# Patient Record
Sex: Male | Born: 1945 | Race: White | Hispanic: No | Marital: Married | Smoking: Former smoker
Health system: Southern US, Community
[De-identification: ages and names within clinical notes are randomized; demographics above are authoritative.]

## PROBLEM LIST (undated history)

## (undated) DIAGNOSIS — E785 Hyperlipidemia, unspecified: Secondary | ICD-10-CM

## (undated) DIAGNOSIS — C4492 Squamous cell carcinoma of skin, unspecified: Secondary | ICD-10-CM

## (undated) DIAGNOSIS — C439 Malignant melanoma of skin, unspecified: Secondary | ICD-10-CM

## (undated) DIAGNOSIS — C4491 Basal cell carcinoma of skin, unspecified: Secondary | ICD-10-CM

## (undated) DIAGNOSIS — H9190 Unspecified hearing loss, unspecified ear: Secondary | ICD-10-CM

## (undated) DIAGNOSIS — D179 Benign lipomatous neoplasm, unspecified: Secondary | ICD-10-CM

## (undated) DIAGNOSIS — R159 Full incontinence of feces: Secondary | ICD-10-CM

## (undated) DIAGNOSIS — I10 Essential (primary) hypertension: Secondary | ICD-10-CM

## (undated) DIAGNOSIS — M6530 Trigger finger, unspecified finger: Secondary | ICD-10-CM

## (undated) HISTORY — DX: Benign lipomatous neoplasm, unspecified: D17.9

## (undated) HISTORY — DX: Full incontinence of feces: R15.9

## (undated) HISTORY — DX: Essential (primary) hypertension: I10

## (undated) HISTORY — PX: TONSILLECTOMY: SUR1361

## (undated) HISTORY — DX: Hyperlipidemia, unspecified: E78.5

## (undated) HISTORY — DX: Unspecified hearing loss, unspecified ear: H91.90

## (undated) HISTORY — DX: Trigger finger, unspecified finger: M65.30

---
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
## 1984-09-08 HISTORY — PX: TOTAL KNEE ARTHROPLASTY: SHX125

## 1999-12-27 ENCOUNTER — Ambulatory Visit (HOSPITAL_BASED_OUTPATIENT_CLINIC_OR_DEPARTMENT_OTHER): Admission: RE | Admit: 1999-12-27 | Discharge: 1999-12-27 | Payer: Self-pay | Admitting: Orthopedic Surgery

## 2002-07-21 ENCOUNTER — Ambulatory Visit (HOSPITAL_COMMUNITY): Admission: RE | Admit: 2002-07-21 | Discharge: 2002-07-21 | Payer: Self-pay | Admitting: Gastroenterology

## 2002-08-01 ENCOUNTER — Ambulatory Visit (HOSPITAL_COMMUNITY): Admission: RE | Admit: 2002-08-01 | Discharge: 2002-08-01 | Payer: Self-pay | Admitting: *Deleted

## 2009-08-01 ENCOUNTER — Encounter: Admission: RE | Admit: 2009-08-01 | Discharge: 2009-08-01 | Payer: Self-pay | Admitting: Family Medicine

## 2011-01-24 NOTE — Op Note (Signed)
[facility]  Patient:    Miguel Kirby, Miguel Kirby                           MRN: [id number] Proc. Date: 12/27/99 Adm. Date:  [id number] Attending:  Marlowe [place] CC:         Artist Pais Weingold, M.D. x 2                           Operative Report  PREOPERATIVE DIAGNOSIS:  Right long finger stenosing tenosynovitis.  POSTOPERATIVE DIAGNOSIS:  Right long finger stenosing tenosynovitis.  PROCEDURE:  Release of right long finger A1 pulley.  SURGEON:  Artist Pais. Mina Marble, M.D.  ANESTHESIA:  Monitored anesthesia care with 2% plain lidocaine field block.  TOURNIQUET TIME:  Ten minutes.  COMPLICATIONS:  None.  DRAINS:  None.  DESCRIPTION OF PROCEDURE:  The patient was taken to the operating room after the induction of adequate IV sedation.  The right upper extremity was prepped and draped in the usual sterile fashion.  Esmarch was used to exsanguinate the limb and the tourniquet was inflated to 250 mmHg.  At this point in time, 1.5 cc of 2% plain lidocaine and 0.25% Marcaine were injected into the palmar aspect of the right and in the area of the A1 pulley of the long finger.  A 1.5 cm incision was made. he incision was taken down through the skin and subcutaneous tissues.  The neurovascular bundles on the both the radial and ulnar side were retracted, thus exposing the underlying A1 pulley.  The A1 pulley was split with a 15 blade, thus exposing the underlying _______ tendons.  All adhesions between the sheaths and  the tendons were lysed.  The wound was then thoroughly irrigated and closed with 4-0 nylon and 4-0 horizontal mattress sutures.  A sterile dressing of Xeroform,  4 x 4s, fluffs, and a compressive dressing was applied.  The patient tolerated the procedure well and went to the recovery room in a stable fashion. DD:  12/27/99 TD:  12/27/99 Job: [id number]

## 2011-01-24 NOTE — Op Note (Signed)
   NAMEIGNACE, MANDIGO                              ACCOUNT NO.:  [id number]   MEDICAL RECORD NO.:  [id number]                   PATIENT TYPE:  AMB   LOCATION:  ENDO                                 FACILITY:  [facility]   PHYSICIAN:  Althea Grimmer. Luther Parody, M.D.            DATE OF BIRTH:  [birth year]   DATE OF PROCEDURE:  DATE OF DISCHARGE:  08/01/2002                                 OPERATIVE REPORT   PROCEDURE:  Anorectal manometry.   The anal sphincter resting tone maximal average pressure was 54.8 mmHg and  occurred 2 cm from the anal verge.  This is within normal limits, and  reflects the pressure of the internal sphincter.  The maximal voluntary  contraction pressure reflective of the external sphincter was 196.7 mmHg and  occurred 3 cm from the anal verge.  This, too, is within normal limits.  The  sphincter length was 4.5 cm which is normal.  The maximal squeeze duration  lasted for 43.3 seconds which is voluntary and also within normal limits.  On Vector grams, there was mild decrease in resting pressure anteriorly.  Squeeze pressure were fairly symmetric.  The rectal anal inhibitory reflex  was present and normal.  The threshold of sensation was 20 cc in the rectal  balloon which is borderline normal in that most people can sense 15 cc.  The  patient had his first urge to defecate at 200 cc, and he tolerated 250 cc in  the rectal balloon.  These values are also normal.  Rectal compliance was  normal until 200 cc was instilled in the rectal balloon at which time he had  some spasm.  Preprocedure of the rectal area and digital rectal exam were  unremarkable.   IMPRESSION:  This is essentially a normal study.  The only potential  abnormalities noted were borderline sensation at 20 cc being instilled in  the rectal balloon before the patient clearly sensed it, and a mild decrease  in pressure Anteriorly.  I do not think either one of these would contribute  to his incontinence, and I  suspect that he is not evacuating completely or  cleaning himself adequately and that there is not a true physiologic problem  here.                                               Althea Grimmer. Luther Parody, M.D.    PJS/MEDQ  D:  08/08/2002  T:  08/09/2002  Job:  [id number]   cc:   Tasia Catchings, M.D.  [address]  [place]  [place] [address]  Fax: [phone]

## 2012-03-26 DIAGNOSIS — I10 Essential (primary) hypertension: Secondary | ICD-10-CM | POA: Diagnosis not present

## 2012-03-26 DIAGNOSIS — E782 Mixed hyperlipidemia: Secondary | ICD-10-CM | POA: Diagnosis not present

## 2012-03-26 DIAGNOSIS — Z125 Encounter for screening for malignant neoplasm of prostate: Secondary | ICD-10-CM | POA: Diagnosis not present

## 2012-03-26 DIAGNOSIS — D179 Benign lipomatous neoplasm, unspecified: Secondary | ICD-10-CM | POA: Diagnosis not present

## 2012-03-26 DIAGNOSIS — R42 Dizziness and giddiness: Secondary | ICD-10-CM | POA: Diagnosis not present

## 2012-03-26 DIAGNOSIS — Z1211 Encounter for screening for malignant neoplasm of colon: Secondary | ICD-10-CM | POA: Diagnosis not present

## 2012-03-26 DIAGNOSIS — Z0000 Encounter for general adult medical examination without abnormal findings: Secondary | ICD-10-CM | POA: Diagnosis not present

## 2012-05-27 DIAGNOSIS — L570 Actinic keratosis: Secondary | ICD-10-CM | POA: Diagnosis not present

## 2012-05-27 DIAGNOSIS — C44319 Basal cell carcinoma of skin of other parts of face: Secondary | ICD-10-CM | POA: Diagnosis not present

## 2012-05-27 DIAGNOSIS — L821 Other seborrheic keratosis: Secondary | ICD-10-CM | POA: Diagnosis not present

## 2012-06-23 DIAGNOSIS — C44319 Basal cell carcinoma of skin of other parts of face: Secondary | ICD-10-CM | POA: Diagnosis not present

## 2012-07-06 DIAGNOSIS — Z23 Encounter for immunization: Secondary | ICD-10-CM | POA: Diagnosis not present

## 2012-08-02 DIAGNOSIS — K5730 Diverticulosis of large intestine without perforation or abscess without bleeding: Secondary | ICD-10-CM | POA: Diagnosis not present

## 2012-08-02 DIAGNOSIS — Z1211 Encounter for screening for malignant neoplasm of colon: Secondary | ICD-10-CM | POA: Diagnosis not present

## 2012-09-23 DIAGNOSIS — R05 Cough: Secondary | ICD-10-CM | POA: Diagnosis not present

## 2012-09-23 DIAGNOSIS — J40 Bronchitis, not specified as acute or chronic: Secondary | ICD-10-CM | POA: Diagnosis not present

## 2012-09-23 DIAGNOSIS — J329 Chronic sinusitis, unspecified: Secondary | ICD-10-CM | POA: Diagnosis not present

## 2012-09-27 DIAGNOSIS — E782 Mixed hyperlipidemia: Secondary | ICD-10-CM | POA: Diagnosis not present

## 2012-09-27 DIAGNOSIS — R509 Fever, unspecified: Secondary | ICD-10-CM | POA: Diagnosis not present

## 2013-02-04 DIAGNOSIS — D489 Neoplasm of uncertain behavior, unspecified: Secondary | ICD-10-CM | POA: Diagnosis not present

## 2013-02-04 DIAGNOSIS — L259 Unspecified contact dermatitis, unspecified cause: Secondary | ICD-10-CM | POA: Diagnosis not present

## 2013-02-04 DIAGNOSIS — L820 Inflamed seborrheic keratosis: Secondary | ICD-10-CM | POA: Diagnosis not present

## 2013-02-04 DIAGNOSIS — M795 Residual foreign body in soft tissue: Secondary | ICD-10-CM | POA: Diagnosis not present

## 2013-04-07 DIAGNOSIS — D179 Benign lipomatous neoplasm, unspecified: Secondary | ICD-10-CM | POA: Diagnosis not present

## 2013-04-07 DIAGNOSIS — R5383 Other fatigue: Secondary | ICD-10-CM | POA: Diagnosis not present

## 2013-04-07 DIAGNOSIS — E782 Mixed hyperlipidemia: Secondary | ICD-10-CM | POA: Diagnosis not present

## 2013-04-07 DIAGNOSIS — I10 Essential (primary) hypertension: Secondary | ICD-10-CM | POA: Diagnosis not present

## 2013-04-07 DIAGNOSIS — Z1331 Encounter for screening for depression: Secondary | ICD-10-CM | POA: Diagnosis not present

## 2013-04-07 DIAGNOSIS — Z125 Encounter for screening for malignant neoplasm of prostate: Secondary | ICD-10-CM | POA: Diagnosis not present

## 2013-04-07 DIAGNOSIS — R5381 Other malaise: Secondary | ICD-10-CM | POA: Diagnosis not present

## 2013-04-07 DIAGNOSIS — Z0000 Encounter for general adult medical examination without abnormal findings: Secondary | ICD-10-CM | POA: Diagnosis not present

## 2013-04-18 DIAGNOSIS — H2510 Age-related nuclear cataract, unspecified eye: Secondary | ICD-10-CM | POA: Diagnosis not present

## 2013-06-15 DIAGNOSIS — H903 Sensorineural hearing loss, bilateral: Secondary | ICD-10-CM | POA: Diagnosis not present

## 2013-07-04 DIAGNOSIS — Z23 Encounter for immunization: Secondary | ICD-10-CM | POA: Diagnosis not present

## 2013-07-21 DIAGNOSIS — Z85828 Personal history of other malignant neoplasm of skin: Secondary | ICD-10-CM | POA: Diagnosis not present

## 2013-07-21 DIAGNOSIS — L570 Actinic keratosis: Secondary | ICD-10-CM | POA: Diagnosis not present

## 2013-08-08 DIAGNOSIS — L570 Actinic keratosis: Secondary | ICD-10-CM | POA: Diagnosis not present

## 2013-08-08 DIAGNOSIS — Z85828 Personal history of other malignant neoplasm of skin: Secondary | ICD-10-CM | POA: Diagnosis not present

## 2013-08-08 DIAGNOSIS — L253 Unspecified contact dermatitis due to other chemical products: Secondary | ICD-10-CM | POA: Diagnosis not present

## 2014-01-04 DIAGNOSIS — L219 Seborrheic dermatitis, unspecified: Secondary | ICD-10-CM | POA: Diagnosis not present

## 2014-01-04 DIAGNOSIS — L570 Actinic keratosis: Secondary | ICD-10-CM | POA: Diagnosis not present

## 2014-01-04 DIAGNOSIS — D235 Other benign neoplasm of skin of trunk: Secondary | ICD-10-CM | POA: Diagnosis not present

## 2014-02-20 DIAGNOSIS — H1044 Vernal conjunctivitis: Secondary | ICD-10-CM | POA: Diagnosis not present

## 2014-05-05 DIAGNOSIS — Z23 Encounter for immunization: Secondary | ICD-10-CM | POA: Diagnosis not present

## 2014-05-05 DIAGNOSIS — E782 Mixed hyperlipidemia: Secondary | ICD-10-CM | POA: Diagnosis not present

## 2014-05-05 DIAGNOSIS — I10 Essential (primary) hypertension: Secondary | ICD-10-CM | POA: Diagnosis not present

## 2014-05-05 DIAGNOSIS — Z0000 Encounter for general adult medical examination without abnormal findings: Secondary | ICD-10-CM | POA: Diagnosis not present

## 2014-05-05 DIAGNOSIS — Z125 Encounter for screening for malignant neoplasm of prostate: Secondary | ICD-10-CM | POA: Diagnosis not present

## 2014-06-21 DIAGNOSIS — L219 Seborrheic dermatitis, unspecified: Secondary | ICD-10-CM | POA: Diagnosis not present

## 2014-06-21 DIAGNOSIS — L570 Actinic keratosis: Secondary | ICD-10-CM | POA: Diagnosis not present

## 2014-06-21 DIAGNOSIS — X32XXXD Exposure to sunlight, subsequent encounter: Secondary | ICD-10-CM | POA: Diagnosis not present

## 2014-07-19 DIAGNOSIS — L219 Seborrheic dermatitis, unspecified: Secondary | ICD-10-CM | POA: Diagnosis not present

## 2014-10-11 DIAGNOSIS — H10413 Chronic giant papillary conjunctivitis, bilateral: Secondary | ICD-10-CM | POA: Diagnosis not present

## 2014-10-11 DIAGNOSIS — H2513 Age-related nuclear cataract, bilateral: Secondary | ICD-10-CM | POA: Diagnosis not present

## 2014-11-06 ENCOUNTER — Encounter: Payer: Self-pay | Admitting: *Deleted

## 2014-11-14 DIAGNOSIS — H1033 Unspecified acute conjunctivitis, bilateral: Secondary | ICD-10-CM | POA: Diagnosis not present

## 2014-12-19 DIAGNOSIS — H00012 Hordeolum externum right lower eyelid: Secondary | ICD-10-CM | POA: Diagnosis not present

## 2015-01-31 DIAGNOSIS — L570 Actinic keratosis: Secondary | ICD-10-CM | POA: Diagnosis not present

## 2015-01-31 DIAGNOSIS — Z1283 Encounter for screening for malignant neoplasm of skin: Secondary | ICD-10-CM | POA: Diagnosis not present

## 2015-01-31 DIAGNOSIS — X32XXXD Exposure to sunlight, subsequent encounter: Secondary | ICD-10-CM | POA: Diagnosis not present

## 2015-02-13 DIAGNOSIS — H02104 Unspecified ectropion of left upper eyelid: Secondary | ICD-10-CM | POA: Diagnosis not present

## 2015-02-13 DIAGNOSIS — H10413 Chronic giant papillary conjunctivitis, bilateral: Secondary | ICD-10-CM | POA: Diagnosis not present

## 2015-02-13 DIAGNOSIS — H02101 Unspecified ectropion of right upper eyelid: Secondary | ICD-10-CM | POA: Diagnosis not present

## 2015-05-09 DIAGNOSIS — C44321 Squamous cell carcinoma of skin of nose: Secondary | ICD-10-CM | POA: Diagnosis not present

## 2015-05-09 DIAGNOSIS — L570 Actinic keratosis: Secondary | ICD-10-CM | POA: Diagnosis not present

## 2015-05-09 DIAGNOSIS — X32XXXD Exposure to sunlight, subsequent encounter: Secondary | ICD-10-CM | POA: Diagnosis not present

## 2015-05-23 DIAGNOSIS — Z85828 Personal history of other malignant neoplasm of skin: Secondary | ICD-10-CM | POA: Diagnosis not present

## 2015-05-23 DIAGNOSIS — Z08 Encounter for follow-up examination after completed treatment for malignant neoplasm: Secondary | ICD-10-CM | POA: Diagnosis not present

## 2015-07-05 ENCOUNTER — Ambulatory Visit
Admission: RE | Admit: 2015-07-05 | Discharge: 2015-07-05 | Disposition: A | Discharge status: Home / Self Care | Payer: Medicare Other | Source: Ambulatory Visit | Attending: Family Medicine | Admitting: Family Medicine

## 2015-07-05 ENCOUNTER — Other Ambulatory Visit: Payer: Self-pay | Admitting: Family Medicine

## 2015-07-05 DIAGNOSIS — Z23 Encounter for immunization: Secondary | ICD-10-CM | POA: Diagnosis not present

## 2015-07-05 DIAGNOSIS — R0600 Dyspnea, unspecified: Secondary | ICD-10-CM

## 2015-07-05 DIAGNOSIS — B356 Tinea cruris: Secondary | ICD-10-CM | POA: Diagnosis not present

## 2015-07-05 DIAGNOSIS — I10 Essential (primary) hypertension: Secondary | ICD-10-CM | POA: Diagnosis not present

## 2015-07-05 DIAGNOSIS — Z1211 Encounter for screening for malignant neoplasm of colon: Secondary | ICD-10-CM | POA: Diagnosis not present

## 2015-07-05 DIAGNOSIS — R0602 Shortness of breath: Secondary | ICD-10-CM | POA: Diagnosis not present

## 2015-07-05 DIAGNOSIS — Z0000 Encounter for general adult medical examination without abnormal findings: Secondary | ICD-10-CM | POA: Diagnosis not present

## 2015-07-05 DIAGNOSIS — E782 Mixed hyperlipidemia: Secondary | ICD-10-CM | POA: Diagnosis not present

## 2015-07-05 DIAGNOSIS — Z125 Encounter for screening for malignant neoplasm of prostate: Secondary | ICD-10-CM | POA: Diagnosis not present

## 2015-07-19 DIAGNOSIS — I10 Essential (primary) hypertension: Secondary | ICD-10-CM | POA: Insufficient documentation

## 2015-07-19 DIAGNOSIS — R0602 Shortness of breath: Secondary | ICD-10-CM | POA: Insufficient documentation

## 2015-07-19 DIAGNOSIS — E782 Mixed hyperlipidemia: Secondary | ICD-10-CM | POA: Insufficient documentation

## 2015-07-23 ENCOUNTER — Encounter: Payer: Self-pay | Admitting: Internal Medicine

## 2015-07-23 ENCOUNTER — Ambulatory Visit (INDEPENDENT_AMBULATORY_CARE_PROVIDER_SITE_OTHER): Payer: Medicare Other | Admitting: Internal Medicine

## 2015-07-23 VITALS — BP 124/66 | HR 61 | Ht 69.0 in | Wt 173.1 lb

## 2015-07-23 DIAGNOSIS — R0602 Shortness of breath: Secondary | ICD-10-CM | POA: Diagnosis not present

## 2015-07-23 DIAGNOSIS — I10 Essential (primary) hypertension: Secondary | ICD-10-CM | POA: Diagnosis not present

## 2015-07-23 NOTE — Patient Instructions (Signed)

## 2015-07-23 NOTE — Progress Notes (Signed)
Cardiology Office Note   Date:  07/23/2015   ID:  Miguel Kirby, DOB [birth year], MRN [id number]  PCP:  Miguel Neer, MD  Cardiologist:   Miguel Carnes, MD   No chief complaint on file.  Referred for SOB    History of Present Illness: Miguel Kirby is a 69 y.o. male with a history of HTN, HL and depression   slo a history of dyspnea    Pt says that he has no CP   Has noticed past6 months  SOB  Has to stop doing things  Catch  Breath Lightheaded occasionally  No presynope or syncpe   No PND  No edema   Pt says he can walk behind self propelled mower  Does OK  If has to push however has to stop  Did not have to do this 6 months ago       Current Outpatient Prescriptions  Medication Sig Dispense Refill  . amLODipine (NORVASC) 5 MG tablet Take 1 tablet by mouth daily.    . cetirizine (ZYRTEC) 10 MG tablet Take 10 mg by mouth daily.    Marland Kitchen losartan-hydrochlorothiazide (HYZAAR) 100-25 MG tablet Take 1 tablet by mouth daily.    . simvastatin (ZOCOR) 10 MG tablet Take 1 tablet by mouth daily.     No current facility-administered medications for this visit.    Allergies:   Review of patient's allergies indicates no known allergies.   Past Medical History  Diagnosis Date  . HLD (hyperlipidemia)   . Fecal incontinence   . HL (hearing loss)   . HBP (high blood pressure)   . Trigger finger     multiple  . Lipoma     Past Surgical History  Procedure Laterality Date  . Tonsillectomy    . Total knee arthroplasty Right 1986     Social History:  The patient  reports that he has quit smoking. He does not have any smokeless tobacco history on file. He reports that he drinks about 6.0 oz of alcohol per week.   Family History:  The patient's family history includes Congestive Heart Failure in his father; Coronary artery disease in his mother; Hypertension in his father. There is no history of Other.    ROS:  Please see the history of present illness. All other systems are  reviewed and  Negative to the above problem except as noted.    PHYSICAL EXAM: VS:  BP 124/66 mmHg  Pulse 61  Ht 5\' 9"  (1.753 m)  Wt 78.527 kg (173 lb 1.9 oz)  BMI 25.55 kg/m2  GEN: Well nourished, well developed, in no acute distress HEENT: normal Neck: no JVD, carotid bruits, or masses Cardiac: RRR; no murmurs, rubs, or gallops,no edema  Respiratory:  clear to auscultation bilaterally, normal work of breathing GI: soft, nontender, nondistended, + BS  No hepatomegaly  MS: no deformity Moving all extremities   Skin: warm and dry, no rash Neuro:  Strength and sensation are intact Psych: euthymic mood, full affect   EKG:  EKG is ordered today.  SR 60  Occasional PAC   Lipid Panel No results found for: CHOL, TRIG, HDL, CHOLHDL, VLDL, LDLCALC, LDLDIRECT    Wt Readings from Last 3 Encounters:  07/23/15 78.527 kg (173 lb 1.9 oz)      ASSESSMENT AND PLAN:  1  SOB  Exam normal  CXR and and labs OK  EKG OK   Would set up for echo to evaluate systolic and diastolic function, pulm pressuress If normal recomm  stress myoview  If LVEF down then cath Contnue activies as tolerated  2.  HTN  Good control  3.  HL  Good control  On review of outside labs    F/U based on test results       Signed, Miguel Carnes, MD  07/23/2015 11:56 AM    [name], [name], [place]  [address] Phone: [phone]; Fax: [phone]

## 2015-07-27 ENCOUNTER — Ambulatory Visit (HOSPITAL_COMMUNITY): Payer: Medicare Other | Attending: Cardiology

## 2015-07-27 ENCOUNTER — Other Ambulatory Visit: Payer: Self-pay

## 2015-07-27 DIAGNOSIS — R0602 Shortness of breath: Secondary | ICD-10-CM | POA: Diagnosis present

## 2015-07-27 DIAGNOSIS — I071 Rheumatic tricuspid insufficiency: Secondary | ICD-10-CM | POA: Diagnosis not present

## 2015-07-27 DIAGNOSIS — I340 Nonrheumatic mitral (valve) insufficiency: Secondary | ICD-10-CM | POA: Diagnosis not present

## 2015-07-27 DIAGNOSIS — Z87891 Personal history of nicotine dependence: Secondary | ICD-10-CM | POA: Insufficient documentation

## 2015-07-27 DIAGNOSIS — I5189 Other ill-defined heart diseases: Secondary | ICD-10-CM | POA: Insufficient documentation

## 2015-07-27 DIAGNOSIS — E785 Hyperlipidemia, unspecified: Secondary | ICD-10-CM | POA: Insufficient documentation

## 2015-07-31 ENCOUNTER — Telehealth: Payer: Self-pay | Admitting: *Deleted

## 2015-07-31 DIAGNOSIS — R0602 Shortness of breath: Secondary | ICD-10-CM

## 2015-07-31 NOTE — Telephone Encounter (Signed)
Informed patient of echo results and that scheduler will be calling to set up myoview.  Pt verbalizes understanding/agreement.

## 2015-07-31 NOTE — Telephone Encounter (Signed)
-----   Message from Dorris Carnes V, MD sent at 07/27/2015  4:22 PM EST ----- Echo shws normal pumping function of the heart No significant valve problems   WOUld recomm stres myoview to r/o ischemia Carlton Adam myoview if he cannot walk though he should be able to)

## 2015-08-16 ENCOUNTER — Telehealth (HOSPITAL_COMMUNITY): Payer: Self-pay | Admitting: *Deleted

## 2015-08-16 NOTE — Telephone Encounter (Signed)
Patient given detailed instructions per Myocardial Perfusion Study Information Sheet for the test on 08/21/15 at 945. Patient notified to arrive 15 minutes early and that it is imperative to arrive on time for appointment to keep from having the test rescheduled.  If you need to cancel or reschedule your appointment, please call the office within 24 hours of your appointment. Failure to do so may result in a cancellation of your appointment, and a $50 no show fee. Patient verbalized understanding.Hubbard Robinson, RN

## 2015-08-21 ENCOUNTER — Ambulatory Visit (HOSPITAL_COMMUNITY): Payer: Medicare Other | Attending: Cardiology

## 2015-08-21 DIAGNOSIS — R0602 Shortness of breath: Secondary | ICD-10-CM

## 2015-08-21 DIAGNOSIS — I10 Essential (primary) hypertension: Secondary | ICD-10-CM | POA: Diagnosis not present

## 2015-08-21 DIAGNOSIS — R9439 Abnormal result of other cardiovascular function study: Secondary | ICD-10-CM | POA: Insufficient documentation

## 2015-08-21 DIAGNOSIS — R0609 Other forms of dyspnea: Secondary | ICD-10-CM | POA: Diagnosis not present

## 2015-08-21 LAB — MYOCARDIAL PERFUSION IMAGING
CHL CUP MPHR: 149 {beats}/min
CHL CUP RESTING HR STRESS: 65 {beats}/min
CSEPEDS: 0 s
CSEPEW: 8.5 METS
Exercise duration (min): 7 min
LVDIAVOL: 69 mL
LVSYSVOL: 17 mL
NUC STRESS TID: 0.8
Peak HR: 130 {beats}/min
Percent HR: 87 %
RATE: 0.32
SDS: 6
SRS: 0
SSS: 6

## 2015-08-21 MED ORDER — REGADENOSON 0.4 MG/5ML IV SOLN
0.4000 mg | Freq: Once | INTRAVENOUS | Status: AC
  Administered 2015-08-21: 0.4 mg via INTRAVENOUS

## 2015-08-21 MED ORDER — TECHNETIUM TC 99M SESTAMIBI GENERIC - CARDIOLITE
31.7000 | Freq: Once | INTRAVENOUS | Status: AC | PRN
  Administered 2015-08-21: 31.7 via INTRAVENOUS

## 2015-08-21 MED ORDER — TECHNETIUM TC 99M SESTAMIBI GENERIC - CARDIOLITE
10.6000 | Freq: Once | INTRAVENOUS | Status: AC | PRN
  Administered 2015-08-21: 11 via INTRAVENOUS

## 2015-09-05 DIAGNOSIS — L570 Actinic keratosis: Secondary | ICD-10-CM | POA: Diagnosis not present

## 2015-09-05 DIAGNOSIS — L309 Dermatitis, unspecified: Secondary | ICD-10-CM | POA: Diagnosis not present

## 2015-09-05 DIAGNOSIS — X32XXXD Exposure to sunlight, subsequent encounter: Secondary | ICD-10-CM | POA: Diagnosis not present

## 2015-10-08 DIAGNOSIS — D239 Other benign neoplasm of skin, unspecified: Secondary | ICD-10-CM | POA: Diagnosis not present

## 2015-10-08 DIAGNOSIS — L570 Actinic keratosis: Secondary | ICD-10-CM | POA: Diagnosis not present

## 2015-10-08 DIAGNOSIS — L409 Psoriasis, unspecified: Secondary | ICD-10-CM | POA: Diagnosis not present

## 2015-11-05 DIAGNOSIS — L570 Actinic keratosis: Secondary | ICD-10-CM | POA: Diagnosis not present

## 2015-11-05 DIAGNOSIS — L409 Psoriasis, unspecified: Secondary | ICD-10-CM | POA: Diagnosis not present

## 2015-12-24 DIAGNOSIS — L570 Actinic keratosis: Secondary | ICD-10-CM | POA: Diagnosis not present

## 2016-02-25 DIAGNOSIS — D0439 Carcinoma in situ of skin of other parts of face: Secondary | ICD-10-CM | POA: Diagnosis not present

## 2016-02-25 DIAGNOSIS — D0412 Carcinoma in situ of skin of left eyelid, including canthus: Secondary | ICD-10-CM | POA: Diagnosis not present

## 2016-02-25 DIAGNOSIS — L570 Actinic keratosis: Secondary | ICD-10-CM | POA: Diagnosis not present

## 2016-02-25 DIAGNOSIS — C4492 Squamous cell carcinoma of skin, unspecified: Secondary | ICD-10-CM

## 2016-02-25 HISTORY — DX: Squamous cell carcinoma of skin, unspecified: C44.92

## 2016-06-30 DIAGNOSIS — H2513 Age-related nuclear cataract, bilateral: Secondary | ICD-10-CM | POA: Diagnosis not present

## 2016-06-30 DIAGNOSIS — H524 Presbyopia: Secondary | ICD-10-CM | POA: Diagnosis not present

## 2016-07-21 DIAGNOSIS — L570 Actinic keratosis: Secondary | ICD-10-CM | POA: Diagnosis not present

## 2016-07-21 DIAGNOSIS — L219 Seborrheic dermatitis, unspecified: Secondary | ICD-10-CM | POA: Diagnosis not present

## 2016-07-21 DIAGNOSIS — D049 Carcinoma in situ of skin, unspecified: Secondary | ICD-10-CM | POA: Diagnosis not present

## 2016-07-25 DIAGNOSIS — Z23 Encounter for immunization: Secondary | ICD-10-CM | POA: Diagnosis not present

## 2016-08-18 DIAGNOSIS — Z0000 Encounter for general adult medical examination without abnormal findings: Secondary | ICD-10-CM | POA: Diagnosis not present

## 2016-08-18 DIAGNOSIS — Z125 Encounter for screening for malignant neoplasm of prostate: Secondary | ICD-10-CM | POA: Diagnosis not present

## 2016-08-18 DIAGNOSIS — E782 Mixed hyperlipidemia: Secondary | ICD-10-CM | POA: Diagnosis not present

## 2016-08-18 DIAGNOSIS — I10 Essential (primary) hypertension: Secondary | ICD-10-CM | POA: Diagnosis not present

## 2016-10-02 DIAGNOSIS — C4491 Basal cell carcinoma of skin, unspecified: Secondary | ICD-10-CM

## 2016-10-02 DIAGNOSIS — D492 Neoplasm of unspecified behavior of bone, soft tissue, and skin: Secondary | ICD-10-CM | POA: Diagnosis not present

## 2016-10-02 DIAGNOSIS — C44311 Basal cell carcinoma of skin of nose: Secondary | ICD-10-CM | POA: Diagnosis not present

## 2016-10-02 DIAGNOSIS — L219 Seborrheic dermatitis, unspecified: Secondary | ICD-10-CM | POA: Diagnosis not present

## 2016-10-02 DIAGNOSIS — L570 Actinic keratosis: Secondary | ICD-10-CM | POA: Diagnosis not present

## 2016-10-02 HISTORY — DX: Basal cell carcinoma of skin, unspecified: C44.91

## 2016-10-20 DIAGNOSIS — M79601 Pain in right arm: Secondary | ICD-10-CM | POA: Diagnosis not present

## 2016-12-17 DIAGNOSIS — C44311 Basal cell carcinoma of skin of nose: Secondary | ICD-10-CM | POA: Diagnosis not present

## 2016-12-17 DIAGNOSIS — D485 Neoplasm of uncertain behavior of skin: Secondary | ICD-10-CM | POA: Diagnosis not present

## 2017-02-16 DIAGNOSIS — L570 Actinic keratosis: Secondary | ICD-10-CM | POA: Diagnosis not present

## 2017-02-16 DIAGNOSIS — Z85828 Personal history of other malignant neoplasm of skin: Secondary | ICD-10-CM | POA: Diagnosis not present

## 2017-04-02 DIAGNOSIS — L218 Other seborrheic dermatitis: Secondary | ICD-10-CM | POA: Diagnosis not present

## 2017-04-02 DIAGNOSIS — L578 Other skin changes due to chronic exposure to nonionizing radiation: Secondary | ICD-10-CM | POA: Diagnosis not present

## 2017-05-24 IMAGING — CR DG CHEST 2V
2 series · 2 of 2 positions shown · non-contrast
Comparison: None.

CLINICAL DATA: Short of breath for 2 months. History of
hypertension. Former smoker.

EXAM:
CHEST  2 VIEW

[w chest pa]
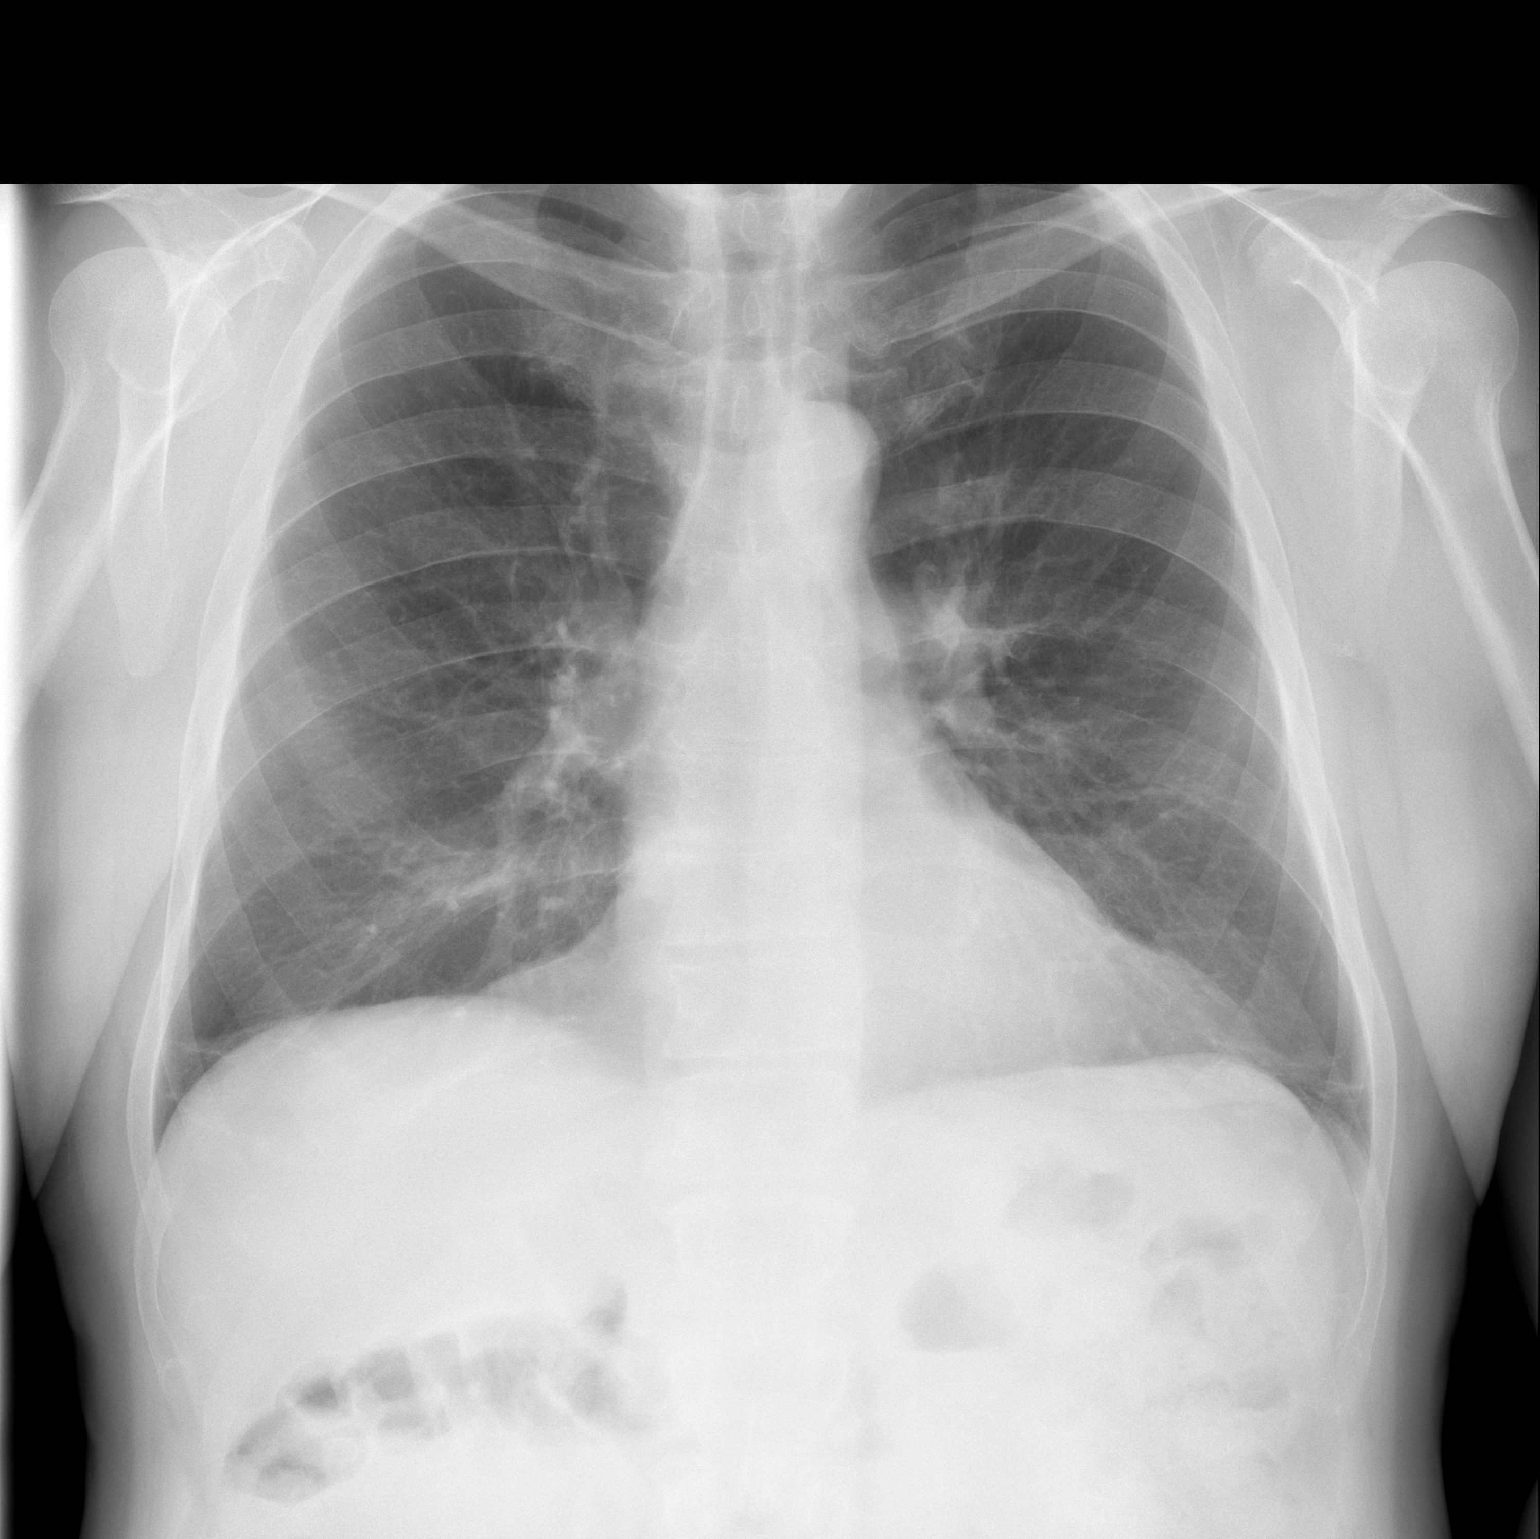

[w chest lat]
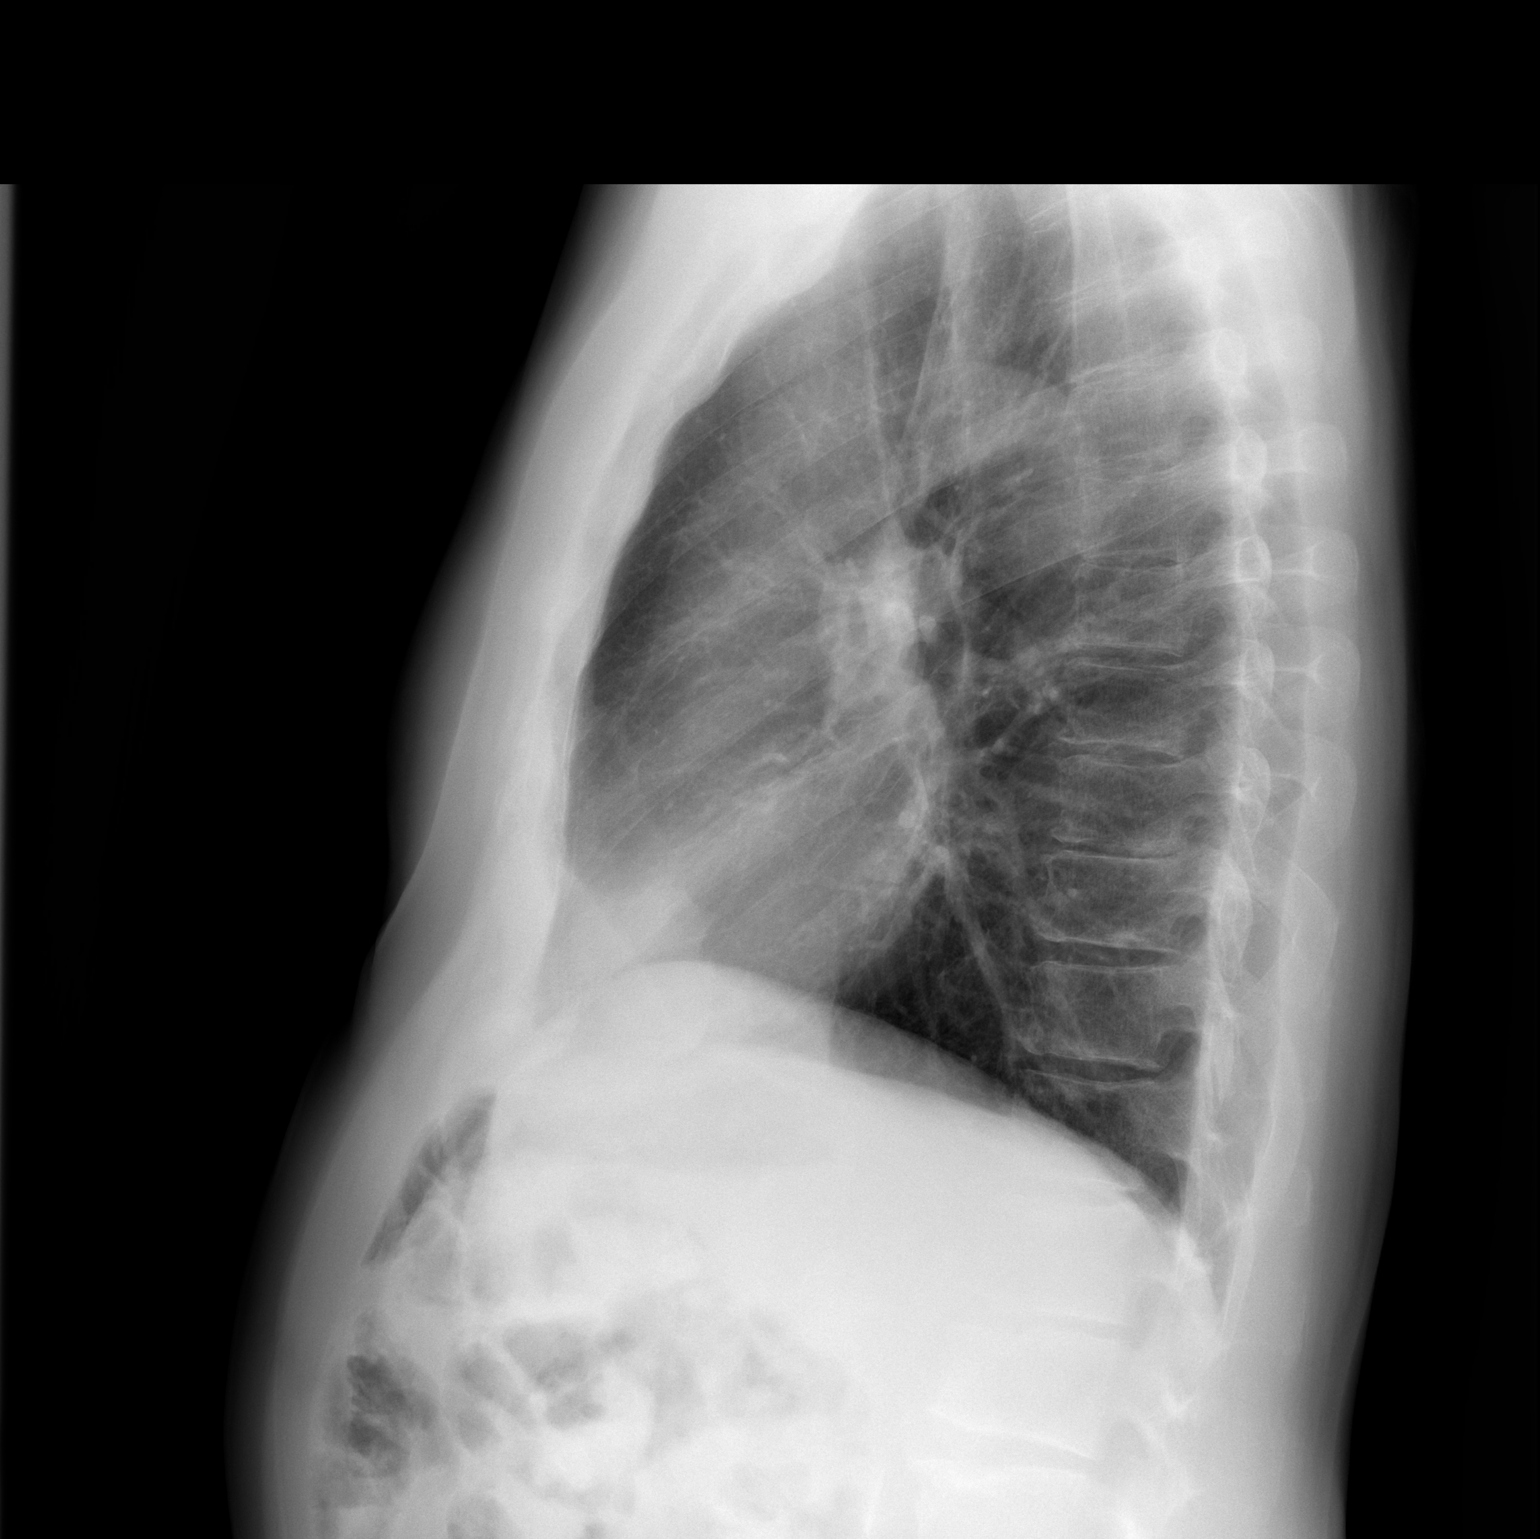

[2 of 2 positions shown; findings below may reference images not displayed]

FINDINGS: Cardiac silhouette is normal in size and configuration. Normal
mediastinal and hilar contours. Mild linear opacity noted at the
lung bases likely subsegmental atelectasis, scarring or a
combination. Lungs otherwise clear. No pleural effusion or
pneumothorax.

Bony thorax is intact.
IMPRESSION: No active cardiopulmonary disease.

## 2017-06-11 DIAGNOSIS — Z85828 Personal history of other malignant neoplasm of skin: Secondary | ICD-10-CM | POA: Diagnosis not present

## 2017-06-11 DIAGNOSIS — L578 Other skin changes due to chronic exposure to nonionizing radiation: Secondary | ICD-10-CM | POA: Diagnosis not present

## 2017-06-11 DIAGNOSIS — C44329 Squamous cell carcinoma of skin of other parts of face: Secondary | ICD-10-CM | POA: Diagnosis not present

## 2017-06-11 DIAGNOSIS — L570 Actinic keratosis: Secondary | ICD-10-CM | POA: Diagnosis not present

## 2017-06-11 DIAGNOSIS — L821 Other seborrheic keratosis: Secondary | ICD-10-CM | POA: Diagnosis not present

## 2017-06-11 DIAGNOSIS — D485 Neoplasm of uncertain behavior of skin: Secondary | ICD-10-CM | POA: Diagnosis not present

## 2017-06-11 DIAGNOSIS — D229 Melanocytic nevi, unspecified: Secondary | ICD-10-CM | POA: Diagnosis not present

## 2017-07-02 DIAGNOSIS — Z23 Encounter for immunization: Secondary | ICD-10-CM | POA: Diagnosis not present

## 2017-07-06 DIAGNOSIS — H2513 Age-related nuclear cataract, bilateral: Secondary | ICD-10-CM | POA: Diagnosis not present

## 2017-07-06 DIAGNOSIS — H5213 Myopia, bilateral: Secondary | ICD-10-CM | POA: Diagnosis not present

## 2017-07-23 DIAGNOSIS — L570 Actinic keratosis: Secondary | ICD-10-CM | POA: Diagnosis not present

## 2017-08-05 DIAGNOSIS — D04122 Carcinoma in situ of skin of left lower eyelid, including canthus: Secondary | ICD-10-CM | POA: Diagnosis not present

## 2017-08-25 DIAGNOSIS — Z0000 Encounter for general adult medical examination without abnormal findings: Secondary | ICD-10-CM | POA: Diagnosis not present

## 2017-08-25 DIAGNOSIS — I10 Essential (primary) hypertension: Secondary | ICD-10-CM | POA: Diagnosis not present

## 2017-08-25 DIAGNOSIS — E782 Mixed hyperlipidemia: Secondary | ICD-10-CM | POA: Diagnosis not present

## 2017-08-27 DIAGNOSIS — L570 Actinic keratosis: Secondary | ICD-10-CM | POA: Diagnosis not present

## 2017-09-29 DIAGNOSIS — H02105 Unspecified ectropion of left lower eyelid: Secondary | ICD-10-CM | POA: Diagnosis not present

## 2017-10-07 DIAGNOSIS — L570 Actinic keratosis: Secondary | ICD-10-CM | POA: Diagnosis not present

## 2017-11-04 DIAGNOSIS — Z48817 Encounter for surgical aftercare following surgery on the skin and subcutaneous tissue: Secondary | ICD-10-CM | POA: Diagnosis not present

## 2017-12-04 DIAGNOSIS — L218 Other seborrheic dermatitis: Secondary | ICD-10-CM | POA: Diagnosis not present

## 2017-12-04 DIAGNOSIS — L819 Disorder of pigmentation, unspecified: Secondary | ICD-10-CM | POA: Diagnosis not present

## 2017-12-04 DIAGNOSIS — L570 Actinic keratosis: Secondary | ICD-10-CM | POA: Diagnosis not present

## 2017-12-28 DIAGNOSIS — H02102 Unspecified ectropion of right lower eyelid: Secondary | ICD-10-CM | POA: Diagnosis not present

## 2017-12-28 DIAGNOSIS — H02105 Unspecified ectropion of left lower eyelid: Secondary | ICD-10-CM | POA: Diagnosis not present

## 2017-12-28 DIAGNOSIS — H1033 Unspecified acute conjunctivitis, bilateral: Secondary | ICD-10-CM | POA: Diagnosis not present

## 2018-01-01 DIAGNOSIS — J069 Acute upper respiratory infection, unspecified: Secondary | ICD-10-CM | POA: Diagnosis not present

## 2018-01-18 DIAGNOSIS — H01002 Unspecified blepharitis right lower eyelid: Secondary | ICD-10-CM | POA: Diagnosis not present

## 2018-01-18 DIAGNOSIS — H5213 Myopia, bilateral: Secondary | ICD-10-CM | POA: Diagnosis not present

## 2018-01-18 DIAGNOSIS — H01005 Unspecified blepharitis left lower eyelid: Secondary | ICD-10-CM | POA: Diagnosis not present

## 2018-01-18 DIAGNOSIS — H2513 Age-related nuclear cataract, bilateral: Secondary | ICD-10-CM | POA: Diagnosis not present

## 2018-02-15 DIAGNOSIS — H01002 Unspecified blepharitis right lower eyelid: Secondary | ICD-10-CM | POA: Diagnosis not present

## 2018-02-15 DIAGNOSIS — H01005 Unspecified blepharitis left lower eyelid: Secondary | ICD-10-CM | POA: Diagnosis not present

## 2018-02-15 DIAGNOSIS — H04123 Dry eye syndrome of bilateral lacrimal glands: Secondary | ICD-10-CM | POA: Diagnosis not present

## 2018-05-13 DIAGNOSIS — C439 Malignant melanoma of skin, unspecified: Secondary | ICD-10-CM

## 2018-05-13 DIAGNOSIS — D0439 Carcinoma in situ of skin of other parts of face: Secondary | ICD-10-CM | POA: Diagnosis not present

## 2018-05-13 DIAGNOSIS — C4330 Malignant melanoma of unspecified part of face: Secondary | ICD-10-CM | POA: Diagnosis not present

## 2018-05-13 DIAGNOSIS — D0339 Melanoma in situ of other parts of face: Secondary | ICD-10-CM | POA: Diagnosis not present

## 2018-05-13 DIAGNOSIS — D485 Neoplasm of uncertain behavior of skin: Secondary | ICD-10-CM | POA: Diagnosis not present

## 2018-05-13 DIAGNOSIS — L720 Epidermal cyst: Secondary | ICD-10-CM | POA: Diagnosis not present

## 2018-05-13 DIAGNOSIS — L570 Actinic keratosis: Secondary | ICD-10-CM | POA: Diagnosis not present

## 2018-05-13 HISTORY — DX: Malignant melanoma of skin, unspecified: C43.9

## 2018-06-11 DIAGNOSIS — Z23 Encounter for immunization: Secondary | ICD-10-CM | POA: Diagnosis not present

## 2018-06-30 DIAGNOSIS — L905 Scar conditions and fibrosis of skin: Secondary | ICD-10-CM | POA: Diagnosis not present

## 2018-06-30 DIAGNOSIS — D0339 Melanoma in situ of other parts of face: Secondary | ICD-10-CM | POA: Diagnosis not present

## 2018-06-30 DIAGNOSIS — L989 Disorder of the skin and subcutaneous tissue, unspecified: Secondary | ICD-10-CM | POA: Diagnosis not present

## 2018-07-12 DIAGNOSIS — H2513 Age-related nuclear cataract, bilateral: Secondary | ICD-10-CM | POA: Diagnosis not present

## 2018-07-12 DIAGNOSIS — H5213 Myopia, bilateral: Secondary | ICD-10-CM | POA: Diagnosis not present

## 2018-07-26 DIAGNOSIS — D0439 Carcinoma in situ of skin of other parts of face: Secondary | ICD-10-CM | POA: Diagnosis not present

## 2018-09-23 ENCOUNTER — Other Ambulatory Visit: Payer: Self-pay | Admitting: Family Medicine

## 2018-09-23 ENCOUNTER — Ambulatory Visit
Admission: RE | Admit: 2018-09-23 | Discharge: 2018-09-23 | Disposition: A | Discharge status: Home / Self Care | Payer: Medicare Other | Source: Ambulatory Visit | Attending: Family Medicine | Admitting: Family Medicine

## 2018-09-23 DIAGNOSIS — Z0000 Encounter for general adult medical examination without abnormal findings: Secondary | ICD-10-CM | POA: Diagnosis not present

## 2018-09-23 DIAGNOSIS — R9389 Abnormal findings on diagnostic imaging of other specified body structures: Secondary | ICD-10-CM

## 2018-09-23 DIAGNOSIS — R0600 Dyspnea, unspecified: Secondary | ICD-10-CM | POA: Diagnosis not present

## 2018-09-23 DIAGNOSIS — R918 Other nonspecific abnormal finding of lung field: Secondary | ICD-10-CM | POA: Diagnosis not present

## 2018-09-23 DIAGNOSIS — R140 Abdominal distension (gaseous): Secondary | ICD-10-CM

## 2018-09-23 DIAGNOSIS — E782 Mixed hyperlipidemia: Secondary | ICD-10-CM | POA: Diagnosis not present

## 2018-09-23 DIAGNOSIS — I10 Essential (primary) hypertension: Secondary | ICD-10-CM | POA: Diagnosis not present

## 2018-09-29 ENCOUNTER — Other Ambulatory Visit: Payer: Self-pay | Admitting: Family Medicine

## 2018-09-29 ENCOUNTER — Ambulatory Visit
Admission: RE | Admit: 2018-09-29 | Discharge: 2018-09-29 | Disposition: A | Discharge status: Home / Self Care | Payer: Medicare Other | Source: Ambulatory Visit | Attending: Family Medicine | Admitting: Family Medicine

## 2018-09-29 DIAGNOSIS — K8689 Other specified diseases of pancreas: Secondary | ICD-10-CM

## 2018-09-29 DIAGNOSIS — K8020 Calculus of gallbladder without cholecystitis without obstruction: Secondary | ICD-10-CM | POA: Diagnosis not present

## 2018-09-29 DIAGNOSIS — R140 Abdominal distension (gaseous): Secondary | ICD-10-CM

## 2018-09-30 ENCOUNTER — Other Ambulatory Visit: Payer: Medicare Other

## 2018-09-30 ENCOUNTER — Ambulatory Visit
Admission: RE | Admit: 2018-09-30 | Discharge: 2018-09-30 | Disposition: A | Discharge status: Home / Self Care | Payer: Medicare Other | Source: Ambulatory Visit | Attending: Family Medicine | Admitting: Family Medicine

## 2018-09-30 DIAGNOSIS — K8689 Other specified diseases of pancreas: Secondary | ICD-10-CM

## 2018-09-30 DIAGNOSIS — R9389 Abnormal findings on diagnostic imaging of other specified body structures: Secondary | ICD-10-CM

## 2018-09-30 DIAGNOSIS — I700 Atherosclerosis of aorta: Secondary | ICD-10-CM | POA: Diagnosis not present

## 2018-09-30 DIAGNOSIS — K8020 Calculus of gallbladder without cholecystitis without obstruction: Secondary | ICD-10-CM | POA: Diagnosis not present

## 2018-09-30 MED ORDER — IOPAMIDOL (ISOVUE-300) INJECTION 61%
100.0000 mL | Freq: Once | INTRAVENOUS | Status: AC | PRN
  Administered 2018-09-30: 100 mL via INTRAVENOUS

## 2018-11-01 DIAGNOSIS — L570 Actinic keratosis: Secondary | ICD-10-CM | POA: Diagnosis not present

## 2018-11-01 DIAGNOSIS — D485 Neoplasm of uncertain behavior of skin: Secondary | ICD-10-CM | POA: Diagnosis not present

## 2018-11-01 DIAGNOSIS — L219 Seborrheic dermatitis, unspecified: Secondary | ICD-10-CM | POA: Diagnosis not present

## 2018-11-01 DIAGNOSIS — D044 Carcinoma in situ of skin of scalp and neck: Secondary | ICD-10-CM | POA: Diagnosis not present

## 2018-11-16 DIAGNOSIS — H10403 Unspecified chronic conjunctivitis, bilateral: Secondary | ICD-10-CM | POA: Diagnosis not present

## 2018-11-16 DIAGNOSIS — H02105 Unspecified ectropion of left lower eyelid: Secondary | ICD-10-CM | POA: Diagnosis not present

## 2018-11-16 DIAGNOSIS — H02102 Unspecified ectropion of right lower eyelid: Secondary | ICD-10-CM | POA: Diagnosis not present

## 2018-12-09 DIAGNOSIS — L570 Actinic keratosis: Secondary | ICD-10-CM | POA: Diagnosis not present

## 2018-12-09 DIAGNOSIS — C44311 Basal cell carcinoma of skin of nose: Secondary | ICD-10-CM | POA: Diagnosis not present

## 2019-02-07 DIAGNOSIS — M67911 Unspecified disorder of synovium and tendon, right shoulder: Secondary | ICD-10-CM | POA: Diagnosis not present

## 2019-02-10 DIAGNOSIS — M7541 Impingement syndrome of right shoulder: Secondary | ICD-10-CM | POA: Diagnosis not present

## 2019-02-14 DIAGNOSIS — M7541 Impingement syndrome of right shoulder: Secondary | ICD-10-CM | POA: Diagnosis not present

## 2019-04-12 DIAGNOSIS — H0100A Unspecified blepharitis right eye, upper and lower eyelids: Secondary | ICD-10-CM | POA: Diagnosis not present

## 2019-04-12 DIAGNOSIS — H02055 Trichiasis without entropian left lower eyelid: Secondary | ICD-10-CM | POA: Diagnosis not present

## 2019-04-12 DIAGNOSIS — H0100B Unspecified blepharitis left eye, upper and lower eyelids: Secondary | ICD-10-CM | POA: Diagnosis not present

## 2019-05-11 DIAGNOSIS — C44311 Basal cell carcinoma of skin of nose: Secondary | ICD-10-CM | POA: Diagnosis not present

## 2019-05-21 DIAGNOSIS — Z23 Encounter for immunization: Secondary | ICD-10-CM | POA: Diagnosis not present

## 2019-07-15 DIAGNOSIS — D0439 Carcinoma in situ of skin of other parts of face: Secondary | ICD-10-CM | POA: Diagnosis not present

## 2019-07-15 DIAGNOSIS — C44329 Squamous cell carcinoma of skin of other parts of face: Secondary | ICD-10-CM | POA: Diagnosis not present

## 2019-07-15 DIAGNOSIS — C4481 Basal cell carcinoma of overlapping sites of skin: Secondary | ICD-10-CM | POA: Diagnosis not present

## 2019-07-15 DIAGNOSIS — L570 Actinic keratosis: Secondary | ICD-10-CM | POA: Diagnosis not present

## 2019-07-15 DIAGNOSIS — C4482 Squamous cell carcinoma of overlapping sites of skin: Secondary | ICD-10-CM | POA: Diagnosis not present

## 2019-07-15 DIAGNOSIS — D485 Neoplasm of uncertain behavior of skin: Secondary | ICD-10-CM | POA: Diagnosis not present

## 2019-07-25 DIAGNOSIS — H5213 Myopia, bilateral: Secondary | ICD-10-CM | POA: Diagnosis not present

## 2019-07-25 DIAGNOSIS — H2513 Age-related nuclear cataract, bilateral: Secondary | ICD-10-CM | POA: Diagnosis not present

## 2019-10-07 ENCOUNTER — Ambulatory Visit: Payer: Medicare Other

## 2019-10-13 DIAGNOSIS — Z0000 Encounter for general adult medical examination without abnormal findings: Secondary | ICD-10-CM | POA: Diagnosis not present

## 2019-10-13 DIAGNOSIS — K219 Gastro-esophageal reflux disease without esophagitis: Secondary | ICD-10-CM | POA: Diagnosis not present

## 2019-10-13 DIAGNOSIS — E782 Mixed hyperlipidemia: Secondary | ICD-10-CM | POA: Diagnosis not present

## 2019-10-13 DIAGNOSIS — I700 Atherosclerosis of aorta: Secondary | ICD-10-CM | POA: Diagnosis not present

## 2019-10-13 DIAGNOSIS — I10 Essential (primary) hypertension: Secondary | ICD-10-CM | POA: Diagnosis not present

## 2019-10-13 DIAGNOSIS — Z7189 Other specified counseling: Secondary | ICD-10-CM | POA: Diagnosis not present

## 2019-10-15 ENCOUNTER — Ambulatory Visit: Payer: Medicare Other | Attending: Internal Medicine

## 2019-10-15 DIAGNOSIS — Z23 Encounter for immunization: Secondary | ICD-10-CM | POA: Insufficient documentation

## 2019-10-15 NOTE — Progress Notes (Signed)
   Covid-19 Vaccination Clinic  Name:  Miguel Kirby    MRN: [id number] DOB: Nov 06, 1945  10/15/2019  Mr. [name] was observed post Covid-19 immunization for 15 minutes without incidence. He was provided with Vaccine Information Sheet and instruction to access the V-Safe system.   Mr. [name] was instructed to call 911 with any severe reactions post vaccine: Marland Kitchen Difficulty breathing  . Swelling of your face and throat  . A fast heartbeat  . A bad rash all over your body  . Dizziness and weakness    Immunizations Administered    Name Date Dose VIS Date Route   Pfizer COVID-19 Vaccine 10/15/2019  4:45 PM 0.3 mL 08/19/2019 Intramuscular   Manufacturer: [place]   Lot: [id number]   [place]: [id number]

## 2019-10-20 DIAGNOSIS — C44329 Squamous cell carcinoma of skin of other parts of face: Secondary | ICD-10-CM | POA: Diagnosis not present

## 2019-10-20 DIAGNOSIS — L988 Other specified disorders of the skin and subcutaneous tissue: Secondary | ICD-10-CM | POA: Diagnosis not present

## 2019-10-26 DIAGNOSIS — Z4802 Encounter for removal of sutures: Secondary | ICD-10-CM | POA: Diagnosis not present

## 2019-11-09 ENCOUNTER — Ambulatory Visit: Payer: Medicare Other | Attending: Internal Medicine

## 2019-11-09 DIAGNOSIS — Z23 Encounter for immunization: Secondary | ICD-10-CM | POA: Insufficient documentation

## 2019-11-09 NOTE — Progress Notes (Signed)
   Covid-19 Vaccination Clinic  Name:  Miguel Kirby    MRN: [id number] DOB: 06/10/1946  11/09/2019  [name] was observed post Covid-19 immunization for 15 minutes without incident. He was provided with Vaccine Information Sheet and instruction to access the V-Safe system.   [name] was instructed to call 911 with any severe reactions post vaccine: Marland Kitchen Difficulty breathing  . Swelling of face and throat  . A fast heartbeat  . A bad rash all over body  . Dizziness and weakness   Immunizations Administered    Name Date Dose VIS Date Route   Pfizer COVID-19 Vaccine 11/09/2019  1:09 PM 0.3 mL 08/19/2019 Intramuscular   Manufacturer: [place]   Lot: [id number]   [place]: [id number]

## 2019-11-17 DIAGNOSIS — H0012 Chalazion right lower eyelid: Secondary | ICD-10-CM | POA: Diagnosis not present

## 2019-12-06 DIAGNOSIS — I700 Atherosclerosis of aorta: Secondary | ICD-10-CM | POA: Diagnosis not present

## 2019-12-06 DIAGNOSIS — Z85820 Personal history of malignant melanoma of skin: Secondary | ICD-10-CM | POA: Diagnosis not present

## 2019-12-06 DIAGNOSIS — L989 Disorder of the skin and subcutaneous tissue, unspecified: Secondary | ICD-10-CM | POA: Diagnosis not present

## 2019-12-06 DIAGNOSIS — I10 Essential (primary) hypertension: Secondary | ICD-10-CM | POA: Diagnosis not present

## 2019-12-06 DIAGNOSIS — T798XXA Other early complications of trauma, initial encounter: Secondary | ICD-10-CM | POA: Diagnosis not present

## 2019-12-22 ENCOUNTER — Other Ambulatory Visit: Payer: Self-pay

## 2019-12-22 ENCOUNTER — Ambulatory Visit (INDEPENDENT_AMBULATORY_CARE_PROVIDER_SITE_OTHER): Payer: Medicare Other | Admitting: Dermatology

## 2019-12-22 ENCOUNTER — Encounter: Payer: Self-pay | Admitting: Dermatology

## 2019-12-22 DIAGNOSIS — C4492 Squamous cell carcinoma of skin, unspecified: Secondary | ICD-10-CM

## 2019-12-22 DIAGNOSIS — L570 Actinic keratosis: Secondary | ICD-10-CM | POA: Diagnosis not present

## 2019-12-22 DIAGNOSIS — D044 Carcinoma in situ of skin of scalp and neck: Secondary | ICD-10-CM | POA: Diagnosis not present

## 2019-12-22 DIAGNOSIS — D492 Neoplasm of unspecified behavior of bone, soft tissue, and skin: Secondary | ICD-10-CM

## 2019-12-22 DIAGNOSIS — D485 Neoplasm of uncertain behavior of skin: Secondary | ICD-10-CM

## 2019-12-22 HISTORY — DX: Squamous cell carcinoma of skin, unspecified: C44.92

## 2019-12-22 NOTE — Patient Instructions (Addendum)
Discussed that in the Fall/Winter he could either do Blue or Red light PDT or one month of Fluorouracil 5% cream on his scalp.  He does have a tube of Fluorouracil already.  Biopsy, Surgery (Curettage) & Surgery (Excision) Aftercare Instructions  1. Okay to remove bandage in 24 hours  2. Wash area with soap and water  3. Apply Vaseline to area twice daily until healed (Not Neosporin)  4. Okay to cover with a Band-Aid to decrease the chance of infection or prevent irritation from clothing; also it's okay to uncover lesion at home.  5. Suture instructions: return to our office in 7-10 or 10-14 days for a nurse visit for suture removal. Variable healing with sutures, if pain or itching occurs call our office. It's okay to shower or bathe 24 hours after sutures are given.  6. The following risks may occur after a biopsy, curettage or excision: bleeding, scarring, discoloration, recurrence, infection (redness, yellow drainage, pain or swelling).  7. For questions, concerns and results call our office at [place] before 4pm & Friday before 3pm. Biopsy results will be available in 1 week.

## 2019-12-23 NOTE — Progress Notes (Signed)
   Follow-Up Visit   Subjective  Miguel Kirby is a 74 y.o. male who presents for the following: Follow-up (recheck crusty spots on scalp).  New growths Location: Gallop Duration: Several months Quality: Increased number and size Associated Signs/Symptoms: Itch Modifying Factors:  Severity:  Timing: Context: History of skin cancers  The following portions of the chart were reviewed this encounter and updated as appropriate:     Objective  Well appearing patient in no apparent distress; mood and affect are within normal limits.  All skin waist up examined.   Assessment & Plan  Neoplasm of skin (4) Mid parietal scalp, superior  Skin / nail biopsy Type of biopsy: tangential   Informed consent: discussed and consent obtained   Instrument used: flexible razor blade   Hemostasis achieved with: ferric subsulfate   Outcome: patient tolerated procedure well   Post-procedure details: sterile dressing applied and wound care instructions given   Dressing type: bandage and petrolatum   Additional details:  TX p BX Cx1+5FU 1.0cm  Specimen 1 - Surgical pathology Differential Diagnosis:  Check Margins: No  Mid parietal scalp, inferior  Skin / nail biopsy Type of biopsy: tangential   Informed consent: discussed and consent obtained   Instrument used: flexible razor blade   Hemostasis achieved with: ferric subsulfate   Outcome: patient tolerated procedure well   Post-procedure details: sterile dressing applied and wound care instructions given   Dressing type: bandage and petrolatum   Additional details:  TX p BX Cx1+5FU 1.2cm  Specimen 2 - Surgical pathology Differential Diagnosis:  Check Margins: No  Right parietal scalp, superior  Skin / nail biopsy Type of biopsy: tangential   Informed consent: discussed and consent obtained   Instrument used: flexible razor blade   Hemostasis achieved with: ferric subsulfate   Outcome: patient tolerated procedure well     Post-procedure details: sterile dressing applied and wound care instructions given   Dressing type: bandage and petrolatum   Additional details:  TX p BX Cx1+5FU 2.2cm  Specimen 3 - Surgical pathology Differential Diagnosis:  Check Margins: No  Right parietal scalp, inferior  Skin / nail biopsy Type of biopsy: tangential   Informed consent: discussed and consent obtained   Instrument used: flexible razor blade   Hemostasis achieved with: ferric subsulfate   Post-procedure details: sterile dressing applied and wound care instructions given   Dressing type: bandage and petrolatum   Additional details:  TX p BX Cx1+5FU 1.2cm  Specimen 4 - Surgical pathology Differential Diagnosis:  Check Margins: No

## 2019-12-24 ENCOUNTER — Encounter: Payer: Self-pay | Admitting: Dermatology

## 2019-12-26 ENCOUNTER — Telehealth: Payer: Self-pay

## 2019-12-26 NOTE — Telephone Encounter (Signed)
Phone call to patient with Pathology results and Dr. Onalee Hua recommendations.  Patient aware of pathology results and Dr. Onalee Hua recommendations.  Patient already has an appointment scheduled with Dr. Denna Haggard on 02/02/2020 @ 8am.

## 2019-12-26 NOTE — Telephone Encounter (Signed)
-----   Message from Lavonna Monarch, MD sent at 12/23/2019  9:16 PM EDT ----- Schedule surgery with Dr. Darene Lamer

## 2020-02-02 ENCOUNTER — Encounter: Payer: Self-pay | Admitting: Dermatology

## 2020-02-02 ENCOUNTER — Other Ambulatory Visit: Payer: Self-pay

## 2020-02-02 ENCOUNTER — Ambulatory Visit (INDEPENDENT_AMBULATORY_CARE_PROVIDER_SITE_OTHER): Payer: Medicare Other | Admitting: Dermatology

## 2020-02-02 DIAGNOSIS — D099 Carcinoma in situ, unspecified: Secondary | ICD-10-CM

## 2020-02-02 DIAGNOSIS — L570 Actinic keratosis: Secondary | ICD-10-CM

## 2020-02-02 DIAGNOSIS — D044 Carcinoma in situ of skin of scalp and neck: Secondary | ICD-10-CM | POA: Diagnosis not present

## 2020-02-02 DIAGNOSIS — D485 Neoplasm of uncertain behavior of skin: Secondary | ICD-10-CM | POA: Diagnosis not present

## 2020-02-02 NOTE — Patient Instructions (Addendum)
Biopsy, Surgery (Curettage) & Surgery (Excision) Aftercare Instructions  1. Okay to remove bandage in 24 hours  2. Wash area with soap and water  3. Apply Vaseline to area twice daily until healed (Not Neosporin)  4. Okay to cover with a Band-Aid to decrease the chance of infection or prevent irritation from clothing; also it's okay to uncover lesion at home.  5. Suture instructions: return to our office in 7-10 or 10-14 days for a nurse visit for suture removal. Variable healing with sutures, if pain or itching occurs call our office. It's okay to shower or bathe 24 hours after sutures are given.  6. The following risks may occur after a biopsy, curettage or excision: bleeding, scarring, discoloration, recurrence, infection (redness, yellow drainage, pain or swelling).  7. For questions, concerns and results call our office at [place] before 4pm & Friday before 3pm. Biopsy results will be available in 1 week.  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  Both of these were treated with curette and local fluorouracil and neither had a significant root.  We spent some time talking about the multiple thick crusts on the front of the scalp and the face including one just outside the right eye which is recurrent after previous removal.  We chose to biopsy and treat 2 thick hornlike spots 1 on the right top of ear and the other one on the left front scalp.  Even if these do show early skin cancer, roughly 90% of them will be cured by today's treatment.  He has of bloody crust on the right nostril which may be related to minor injury and we will watch this for 1 to 2 months and biopsy if it does not heal.  Additionally I chatted about some studies looking at niacinamide (same as nicotinamide but NOT THE SAME AS NIACIN).  This is a B derived vitamin with published data that it reduces  nonmelanoma cancers in people with incomplete immunity and a recent publication that it may help people with normal immunity.  The dose is 500 mg 2 daily. The minimal trial is 3 to 6 months and there is no maximal.  I do not know of any side effects of this therapy.  This can be gotten at a pharmacy or other store that carries vitamins or [place] or [place].  Mr. Kitchin will think this over.  Follow-up scheduled as a surgical visit in 2 months. CIS

## 2020-02-11 ENCOUNTER — Encounter: Payer: Self-pay | Admitting: Dermatology

## 2020-02-11 NOTE — Progress Notes (Signed)
Follow-Up Visit   Subjective  Miguel Kirby is a 74 y.o. male who presents for the following: Procedure (here for treatement right parietal scalp,superior- CIS).  CIS Location: Scalp Duration:  Quality:  Associated Signs/Symptoms: Modifying Factors:  Severity:  Timing: Context: Multiple other skin issues  The following portions of the chart were reviewed this encounter and updated as appropriate: Allergies  Meds  Problems  Med Hx  Surg Hx  Fam Hx      Objective  Well appearing patient in no apparent distress; mood and affect are within normal limits.  All skin waist up examined.   Assessment & Plan  AK (actinic keratosis) (2) Mid Parietal Scalp  Destruction of lesion - Mid Parietal Scalp Complexity: simple   Destruction method: electrodesiccation and curettage   Informed consent: discussed and consent obtained   Timeout:  patient name, date of birth, surgical site, and procedure verified Anesthesia: the lesion was anesthetized in a standard fashion   Anesthetic:  1% lidocaine w/ epinephrine 1-100,000 local infiltration Curettage performed in three different directions: Yes   Electrodesiccation performed over the curetted area: Yes   Curettage cycles:  1 Hemostasis achieved with:  ferric subsulfate Outcome: patient tolerated procedure well with no complications   Post-procedure details: wound care instructions given    Neoplasm of uncertain behavior of skin (2) Right Scaphoid Fossa  Skin / nail biopsy Type of biopsy: tangential   Informed consent: discussed and consent obtained   Timeout: patient name, date of birth, surgical site, and procedure verified   Procedure prep:  Patient was prepped and draped in usual sterile fashion Prep type:  Chlorhexidine Anesthesia: the lesion was anesthetized in a standard fashion   Anesthetic:  1% lidocaine w/ epinephrine 1-100,000 local infiltration Instrument used: flexible razor blade   Hemostasis achieved with: ferric  subsulfate   Outcome: patient tolerated procedure well   Post-procedure details: wound care instructions given    Specimen 1 - Surgical pathology Differential Diagnosis: scc vs bcc Check Margins: No  Left Front scalp  Skin / nail biopsy Type of biopsy: tangential   Informed consent: discussed and consent obtained   Timeout: patient name, date of birth, surgical site, and procedure verified   Procedure prep:  Patient was prepped and draped in usual sterile fashion Prep type:  Chlorhexidine Anesthesia: the lesion was anesthetized in a standard fashion   Anesthetic:  1% lidocaine w/ epinephrine 1-100,000 local infiltration Instrument used: flexible razor blade   Hemostasis achieved with: ferric subsulfate   Outcome: patient tolerated procedure well   Post-procedure details: wound care instructions given    Specimen 2 - Surgical pathology Differential Diagnosis: scc vs bcc Check Margins: No  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  Destruction of lesion Complexity: simple   Destruction method: electrodesiccation and curettage   Informed consent: discussed and consent obtained   Timeout:  patient name, date of birth, surgical site, and procedure verified Anesthesia: the lesion was anesthetized in a standard fashion   Anesthetic:  1% lidocaine w/ epinephrine 1-100,000 local infiltration Curettage performed in three different directions: Yes   Curettage cycles:  3 Lesion length (cm):  2.2 Lesion width (cm):  1 Margin per side (cm):  0 Final wound size (cm):  2.2 Hemostasis achieved with:  ferric subsulfate Outcome: patient tolerated procedure well with no complications   Post-procedure details: wound care instructions given   Additional details:  Inoculated with parenteral 5% fluorouracil We first addressed 1 biopsy-proven carcinoma in situ  on the right crown of the scalp and 2 borderline lesions, 1 of which had obvious residual crusting.  Both of these  were treated with curette and local fluorouracil and neither had a significant root.  We spent some time talking about the multiple thick crusts on the front of the scalp and the face including one just outside the right eye which is recurrent after previous removal.  We chose to biopsy and treat 2 thick hornlike spots 1 on the right top of ear and the other one on the left front scalp.  Even if these do show early skin cancer, roughly 90% of them will be cured by today's treatment.  He has of bloody crust on the right nostril which may be related to minor injury and we will watch this for 1 to 2 months and biopsy if it does not heal.  Additionally I chatted about some studies looking at niacinamide (same as nicotinamide but NOT THE SAME AS NIACIN).  This is a B derived vitamin with published data that it reduces nonmelanoma cancers in people with incomplete immunity and a recent publication that it may help people with normal immunity.  The dose is 500 mg 2 daily. The minimal trial is 3 to 6 months and there is no maximal.  I do not know of any side effects of this therapy.  This can be gotten at a pharmacy or other store that carries vitamins or [place] or [place].  Miguel Kirby will think this over.  Follow-up scheduled as a surgical visit in 2 months.

## 2020-04-02 ENCOUNTER — Encounter: Payer: Self-pay | Admitting: Dermatology

## 2020-04-02 ENCOUNTER — Other Ambulatory Visit: Payer: Self-pay

## 2020-04-02 ENCOUNTER — Ambulatory Visit (INDEPENDENT_AMBULATORY_CARE_PROVIDER_SITE_OTHER): Payer: Medicare Other | Admitting: Dermatology

## 2020-04-02 DIAGNOSIS — L570 Actinic keratosis: Secondary | ICD-10-CM | POA: Diagnosis not present

## 2020-04-02 DIAGNOSIS — D485 Neoplasm of uncertain behavior of skin: Secondary | ICD-10-CM | POA: Diagnosis not present

## 2020-04-02 NOTE — Progress Notes (Signed)
Patient has 3 spots that need biopsy's. I took photo's of the 3 spots and

## 2020-04-02 NOTE — Patient Instructions (Signed)

## 2020-05-09 ENCOUNTER — Encounter: Payer: Self-pay | Admitting: Dermatology

## 2020-05-09 NOTE — Progress Notes (Signed)
° °  Follow-Up Visit   Subjective  Miguel Kirby is a 74 y.o. male who presents for the following: Follow-up (pt stated --doing great).  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  Objective  Well appearing patient in no apparent distress; mood and affect are within normal limits.  All skin waist up examined.   Assessment & Plan    Neoplasm of uncertain behavior of skin (3) Right outer eye  Left Buccal Cheek   Left Forehead  ST wants to biopsy at next visit photos and measurements  taken.  AK (actinic keratosis) Mid Forehead  Field therapy with topical fluorouracil or PDT in the late fall or winter      I, Lavonna Monarch, MD, have reviewed all documentation for this visit.  The documentation on 05/09/20 for the exam, diagnosis, procedures, and orders are all accurate and complete.

## 2020-06-02 DIAGNOSIS — Z23 Encounter for immunization: Secondary | ICD-10-CM | POA: Diagnosis not present

## 2020-06-04 ENCOUNTER — Ambulatory Visit (INDEPENDENT_AMBULATORY_CARE_PROVIDER_SITE_OTHER): Payer: Medicare Other | Admitting: Dermatology

## 2020-06-04 ENCOUNTER — Encounter: Payer: Self-pay | Admitting: Dermatology

## 2020-06-04 ENCOUNTER — Other Ambulatory Visit: Payer: Self-pay

## 2020-06-04 DIAGNOSIS — C44311 Basal cell carcinoma of skin of nose: Secondary | ICD-10-CM

## 2020-06-04 DIAGNOSIS — L570 Actinic keratosis: Secondary | ICD-10-CM

## 2020-06-04 DIAGNOSIS — C4401 Basal cell carcinoma of skin of lip: Secondary | ICD-10-CM | POA: Diagnosis not present

## 2020-06-04 DIAGNOSIS — D485 Neoplasm of uncertain behavior of skin: Secondary | ICD-10-CM

## 2020-06-04 DIAGNOSIS — C44712 Basal cell carcinoma of skin of right lower limb, including hip: Secondary | ICD-10-CM | POA: Diagnosis not present

## 2020-06-04 DIAGNOSIS — Z1283 Encounter for screening for malignant neoplasm of skin: Secondary | ICD-10-CM

## 2020-06-04 NOTE — Patient Instructions (Addendum)
Biopsy, Surgery (Curettage) & Surgery (Excision) Aftercare Instructions  1. Okay to remove bandage in 24 hours  2. Wash area with soap and water  3. Apply Vaseline to area twice daily until healed (Not Neosporin)  4. Okay to cover with a Band-Aid to decrease the chance of infection or prevent irritation from clothing; also it's okay to uncover lesion at home.  5. Suture instructions: return to our office in 7-10 or 10-14 days for a nurse visit for suture removal. Variable healing with sutures, if pain or itching occurs call our office. It's okay to shower or bathe 24 hours after sutures are given.  6. The following risks may occur after a biopsy, curettage or excision: bleeding, scarring, discoloration, recurrence, infection (redness, yellow drainage, pain or swelling).  7. For questions, concerns and results call our office at [place] before 4pm & Friday before 3pm. Biopsy results will be available in 1 week.  Several issues discussed today with Miguel Kirby date of birth 05/19/1946.  Of most concern were nonhealing spots on the right chin and the right nostril (in an area of previous Mohs surgery).  Although clinically neither of these is typical of skin cancer, biopsies are clearly indicated (the chin more than the nose has bled several times).  Shave biopsies obtained and Miguel Kirby can check on MyChart or call my office on Thursday.  If the nose shows skin cancer, we will certainly recommend removal by Mohs surgery (he has used Dr. Levada Dy in the past).  He also has several dozen fairly thick precancerous keratoses on the forehead and the scalp; intervention was deferred but he would be a candidate for either topical Tolak or PDT light therapy in the winter.

## 2020-06-07 ENCOUNTER — Telehealth: Payer: Self-pay | Admitting: Dermatology

## 2020-06-07 NOTE — Telephone Encounter (Signed)
Left message for patient to let him know that the pathology results are not back yet.

## 2020-06-07 NOTE — Telephone Encounter (Signed)
Patient calling for biopsy results

## 2020-06-13 ENCOUNTER — Telehealth: Payer: Self-pay | Admitting: Dermatology

## 2020-06-13 NOTE — Telephone Encounter (Signed)
Path to patient started referral to the skin surgery center

## 2020-06-13 NOTE — Telephone Encounter (Signed)
-----   Message from Lavonna Monarch, MD sent at 06/08/2020  7:11 AM EDT ----- Dr. 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

## 2020-06-13 NOTE — Telephone Encounter (Signed)
Patient is calling for pathology results from his last visit with Stuart Tafeen, MD. 

## 2020-07-02 DIAGNOSIS — Z23 Encounter for immunization: Secondary | ICD-10-CM | POA: Diagnosis not present

## 2020-07-04 NOTE — Progress Notes (Signed)
   Follow-Up Visit   Subjective  Miguel Kirby is a 74 y.o. male who presents for the following: Annual Exam (right chin x yrs- comes & goes, right nare - come and gone for several months).  Skin exam Location:  Duration:  Quality:  Associated Signs/Symptoms: Modifying Factors:  Severity:  Timing: Context:   Objective  Well appearing patient in no apparent distress; mood and affect are within normal limits.  A full examination was performed including scalp, head, eyes, ears, nose, lips, neck, chest, axillae, abdomen, back, buttocks, bilateral upper extremities, bilateral lower extremities, hands, feet, fingers, toes, fingernails, and toenails. All findings within normal limits unless otherwise noted below.   Assessment & Plan    Several issues discussed today with Miguel Kirby date of birth 05/19/1946.  Of most concern were nonhealing spots on the right chin and the right nostril (in an area of previous Mohs surgery).  Although clinically neither of these is typical of skin cancer, biopsies are clearly indicated (the chin more than the nose has bled several times).  Shave biopsies obtained and Miguel Kirby can check on MyChart or call my office on Thursday.  If the nose shows skin cancer, we will certainly recommend removal by Mohs surgery (hehas used Dr. Levada Dy in the past).  He also has several dozen fairly thick precancerous keratoses on the forehead and the scalp; intervention was deferred but he would be a candidate for either topical Tolak or PDT light therapy in the winter.  Skin cancer screening performed today.    Neoplasm of uncertain behavior of skin (2) Right Nasal Sidewall  Skin / nail biopsy Type of biopsy: tangential   Informed consent: discussed and consent obtained   Timeout: patient name, date of birth, surgical site, and procedure verified   Anesthesia: the lesion was anesthetized in a standard fashion   Anesthetic:  1% lidocaine w/ epinephrine 1-100,000 local  infiltration Instrument used: flexible razor blade   Hemostasis achieved with: ferric subsulfate   Outcome: patient tolerated procedure well   Post-procedure details: sterile dressing applied and wound care instructions given   Dressing type: bandage and petrolatum    Specimen 1 - Surgical pathology Differential Diagnosis: bcc vs scc Check Margins: No If + MOHS  Right Lower Cutaneous Lip  Skin / nail biopsy Type of biopsy: tangential   Informed consent: discussed and consent obtained   Timeout: patient name, date of birth, surgical site, and procedure verified   Anesthesia: the lesion was anesthetized in a standard fashion   Anesthetic:  1% lidocaine w/ epinephrine 1-100,000 local infiltration Instrument used: flexible razor blade   Hemostasis achieved with: ferric subsulfate   Outcome: patient tolerated procedure well   Post-procedure details: sterile dressing applied and wound care instructions given   Dressing type: bandage and petrolatum    Specimen 2 - Surgical pathology Differential Diagnosis: bcc vs scc Check Margins: No  Encounter for screening for malignant neoplasm of skin Mid Back  Yearly skin exams.   AK (actinic keratosis) (2) Mid Frontal Scalp; Mid Forehead  Patient deferred liquid nitrogen treatment and will be a good candidate for PDT or 5% fluorouracil treatment in fall/winter.      I, Lavonna Monarch, MD, have reviewed all documentation for this visit.  The documentation on 07/07/20 for the exam, diagnosis, procedures, and orders are all accurate and complete.

## 2020-07-07 ENCOUNTER — Encounter: Payer: Self-pay | Admitting: Dermatology

## 2020-07-26 DIAGNOSIS — H04123 Dry eye syndrome of bilateral lacrimal glands: Secondary | ICD-10-CM | POA: Diagnosis not present

## 2020-07-26 DIAGNOSIS — H2513 Age-related nuclear cataract, bilateral: Secondary | ICD-10-CM | POA: Diagnosis not present

## 2020-07-26 DIAGNOSIS — H524 Presbyopia: Secondary | ICD-10-CM | POA: Diagnosis not present

## 2020-08-09 DIAGNOSIS — C44311 Basal cell carcinoma of skin of nose: Secondary | ICD-10-CM | POA: Diagnosis not present

## 2020-08-16 DIAGNOSIS — C44319 Basal cell carcinoma of skin of other parts of face: Secondary | ICD-10-CM | POA: Diagnosis not present

## 2020-08-27 DIAGNOSIS — H04123 Dry eye syndrome of bilateral lacrimal glands: Secondary | ICD-10-CM | POA: Diagnosis not present

## 2020-08-27 DIAGNOSIS — H2513 Age-related nuclear cataract, bilateral: Secondary | ICD-10-CM | POA: Diagnosis not present

## 2020-09-12 ENCOUNTER — Encounter: Payer: Self-pay | Admitting: Dermatology

## 2020-09-12 NOTE — Telephone Encounter (Signed)
Patient had treatment on chin 08/16/20 and nose 08/09/20 with [facility] Dr Elvin So

## 2020-09-17 DIAGNOSIS — Z1152 Encounter for screening for COVID-19: Secondary | ICD-10-CM | POA: Diagnosis not present

## 2020-10-19 DIAGNOSIS — I10 Essential (primary) hypertension: Secondary | ICD-10-CM | POA: Diagnosis not present

## 2020-10-19 DIAGNOSIS — L309 Dermatitis, unspecified: Secondary | ICD-10-CM | POA: Diagnosis not present

## 2020-10-19 DIAGNOSIS — E782 Mixed hyperlipidemia: Secondary | ICD-10-CM | POA: Diagnosis not present

## 2020-10-19 DIAGNOSIS — I700 Atherosclerosis of aorta: Secondary | ICD-10-CM | POA: Diagnosis not present

## 2020-10-19 DIAGNOSIS — Z0000 Encounter for general adult medical examination without abnormal findings: Secondary | ICD-10-CM | POA: Diagnosis not present

## 2020-10-31 DIAGNOSIS — H2511 Age-related nuclear cataract, right eye: Secondary | ICD-10-CM | POA: Diagnosis not present

## 2020-10-31 DIAGNOSIS — H25811 Combined forms of age-related cataract, right eye: Secondary | ICD-10-CM | POA: Diagnosis not present

## 2020-11-28 DIAGNOSIS — H2512 Age-related nuclear cataract, left eye: Secondary | ICD-10-CM | POA: Diagnosis not present

## 2020-11-28 DIAGNOSIS — H25812 Combined forms of age-related cataract, left eye: Secondary | ICD-10-CM | POA: Diagnosis not present

## 2020-12-12 DIAGNOSIS — D485 Neoplasm of uncertain behavior of skin: Secondary | ICD-10-CM | POA: Diagnosis not present

## 2020-12-12 DIAGNOSIS — C44319 Basal cell carcinoma of skin of other parts of face: Secondary | ICD-10-CM | POA: Diagnosis not present

## 2020-12-12 DIAGNOSIS — L570 Actinic keratosis: Secondary | ICD-10-CM | POA: Diagnosis not present

## 2020-12-12 DIAGNOSIS — L578 Other skin changes due to chronic exposure to nonionizing radiation: Secondary | ICD-10-CM | POA: Diagnosis not present

## 2020-12-12 DIAGNOSIS — C4441 Basal cell carcinoma of skin of scalp and neck: Secondary | ICD-10-CM | POA: Diagnosis not present

## 2020-12-12 DIAGNOSIS — Z85828 Personal history of other malignant neoplasm of skin: Secondary | ICD-10-CM | POA: Diagnosis not present

## 2021-01-21 DIAGNOSIS — L570 Actinic keratosis: Secondary | ICD-10-CM | POA: Diagnosis not present

## 2021-01-21 DIAGNOSIS — C4441 Basal cell carcinoma of skin of scalp and neck: Secondary | ICD-10-CM | POA: Diagnosis not present

## 2021-02-19 DIAGNOSIS — E782 Mixed hyperlipidemia: Secondary | ICD-10-CM | POA: Diagnosis not present

## 2021-03-13 DIAGNOSIS — Z85828 Personal history of other malignant neoplasm of skin: Secondary | ICD-10-CM | POA: Diagnosis not present

## 2021-03-13 DIAGNOSIS — D485 Neoplasm of uncertain behavior of skin: Secondary | ICD-10-CM | POA: Diagnosis not present

## 2021-03-13 DIAGNOSIS — L570 Actinic keratosis: Secondary | ICD-10-CM | POA: Diagnosis not present

## 2021-03-13 DIAGNOSIS — C44121 Squamous cell carcinoma of skin of unspecified eyelid, including canthus: Secondary | ICD-10-CM | POA: Diagnosis not present

## 2021-03-13 DIAGNOSIS — C44319 Basal cell carcinoma of skin of other parts of face: Secondary | ICD-10-CM | POA: Diagnosis not present

## 2021-04-10 DIAGNOSIS — Z85828 Personal history of other malignant neoplasm of skin: Secondary | ICD-10-CM | POA: Diagnosis not present

## 2021-04-10 DIAGNOSIS — C441222 Squamous cell carcinoma of skin of right lower eyelid, including canthus: Secondary | ICD-10-CM | POA: Diagnosis not present

## 2021-04-17 DIAGNOSIS — C44319 Basal cell carcinoma of skin of other parts of face: Secondary | ICD-10-CM | POA: Diagnosis not present

## 2021-04-17 DIAGNOSIS — Z85828 Personal history of other malignant neoplasm of skin: Secondary | ICD-10-CM | POA: Diagnosis not present

## 2021-06-03 ENCOUNTER — Ambulatory Visit: Payer: Medicare Other | Attending: Internal Medicine

## 2021-06-03 ENCOUNTER — Other Ambulatory Visit (HOSPITAL_BASED_OUTPATIENT_CLINIC_OR_DEPARTMENT_OTHER): Payer: Self-pay

## 2021-06-03 DIAGNOSIS — Z23 Encounter for immunization: Secondary | ICD-10-CM

## 2021-06-03 MED ORDER — PFIZER COVID-19 VAC BIVALENT 30 MCG/0.3ML IM SUSP
INTRAMUSCULAR | 0 refills | Status: AC
  Filled 2021-06-03: qty 0.3, 1d supply, fill #0

## 2021-06-03 NOTE — Progress Notes (Signed)
   Covid-19 Vaccination Clinic  Name:  Miguel Kirby    MRN: [id number] DOB: 01-25-1946  06/03/2021  Mr. Miguel Kirby was observed post Covid-19 immunization for 15 minutes without incident. He was provided with Vaccine Information Sheet and instruction to access the V-Safe system.   Mr. Miguel Kirby was instructed to call 911 with any severe reactions post vaccine: Difficulty breathing  Swelling of face and throat  A fast heartbeat  A bad rash all over body  Dizziness and weakness

## 2021-06-22 DIAGNOSIS — Z23 Encounter for immunization: Secondary | ICD-10-CM | POA: Diagnosis not present

## 2021-07-26 DIAGNOSIS — Z961 Presence of intraocular lens: Secondary | ICD-10-CM | POA: Diagnosis not present

## 2021-07-26 DIAGNOSIS — H5203 Hypermetropia, bilateral: Secondary | ICD-10-CM | POA: Diagnosis not present

## 2021-08-12 DIAGNOSIS — D1801 Hemangioma of skin and subcutaneous tissue: Secondary | ICD-10-CM | POA: Diagnosis not present

## 2021-08-12 DIAGNOSIS — L570 Actinic keratosis: Secondary | ICD-10-CM | POA: Diagnosis not present

## 2021-08-12 DIAGNOSIS — Z85828 Personal history of other malignant neoplasm of skin: Secondary | ICD-10-CM | POA: Diagnosis not present

## 2021-08-12 DIAGNOSIS — L814 Other melanin hyperpigmentation: Secondary | ICD-10-CM | POA: Diagnosis not present

## 2021-08-12 DIAGNOSIS — L821 Other seborrheic keratosis: Secondary | ICD-10-CM | POA: Diagnosis not present

## 2021-08-12 DIAGNOSIS — B354 Tinea corporis: Secondary | ICD-10-CM | POA: Diagnosis not present

## 2021-09-24 DIAGNOSIS — R0989 Other specified symptoms and signs involving the circulatory and respiratory systems: Secondary | ICD-10-CM | POA: Diagnosis not present

## 2021-09-24 DIAGNOSIS — I10 Essential (primary) hypertension: Secondary | ICD-10-CM | POA: Diagnosis not present

## 2021-09-24 DIAGNOSIS — R0981 Nasal congestion: Secondary | ICD-10-CM | POA: Diagnosis not present

## 2021-10-18 ENCOUNTER — Ambulatory Visit
Admission: RE | Admit: 2021-10-18 | Discharge: 2021-10-18 | Disposition: A | Discharge status: Home / Self Care | Payer: Medicare Other | Source: Ambulatory Visit | Attending: Physician Assistant | Admitting: Physician Assistant

## 2021-10-18 ENCOUNTER — Other Ambulatory Visit: Payer: Self-pay | Admitting: Physician Assistant

## 2021-10-18 DIAGNOSIS — M5489 Other dorsalgia: Secondary | ICD-10-CM

## 2021-10-18 DIAGNOSIS — M545 Low back pain, unspecified: Secondary | ICD-10-CM | POA: Diagnosis not present

## 2021-10-18 DIAGNOSIS — M47816 Spondylosis without myelopathy or radiculopathy, lumbar region: Secondary | ICD-10-CM | POA: Diagnosis not present

## 2021-10-18 DIAGNOSIS — M549 Dorsalgia, unspecified: Secondary | ICD-10-CM | POA: Diagnosis not present

## 2021-10-30 DIAGNOSIS — Z23 Encounter for immunization: Secondary | ICD-10-CM | POA: Diagnosis not present

## 2021-10-30 DIAGNOSIS — I10 Essential (primary) hypertension: Secondary | ICD-10-CM | POA: Diagnosis not present

## 2021-10-30 DIAGNOSIS — E782 Mixed hyperlipidemia: Secondary | ICD-10-CM | POA: Diagnosis not present

## 2021-10-30 DIAGNOSIS — Z0000 Encounter for general adult medical examination without abnormal findings: Secondary | ICD-10-CM | POA: Diagnosis not present

## 2021-10-30 DIAGNOSIS — I700 Atherosclerosis of aorta: Secondary | ICD-10-CM | POA: Diagnosis not present

## 2021-10-30 DIAGNOSIS — M47816 Spondylosis without myelopathy or radiculopathy, lumbar region: Secondary | ICD-10-CM | POA: Diagnosis not present

## 2021-11-04 DIAGNOSIS — M47816 Spondylosis without myelopathy or radiculopathy, lumbar region: Secondary | ICD-10-CM | POA: Diagnosis not present

## 2021-11-05 DIAGNOSIS — M47816 Spondylosis without myelopathy or radiculopathy, lumbar region: Secondary | ICD-10-CM | POA: Diagnosis not present

## 2021-11-12 DIAGNOSIS — M47816 Spondylosis without myelopathy or radiculopathy, lumbar region: Secondary | ICD-10-CM | POA: Diagnosis not present

## 2021-11-14 DIAGNOSIS — M47816 Spondylosis without myelopathy or radiculopathy, lumbar region: Secondary | ICD-10-CM | POA: Diagnosis not present

## 2021-11-19 DIAGNOSIS — M47816 Spondylosis without myelopathy or radiculopathy, lumbar region: Secondary | ICD-10-CM | POA: Diagnosis not present

## 2021-11-21 DIAGNOSIS — M47816 Spondylosis without myelopathy or radiculopathy, lumbar region: Secondary | ICD-10-CM | POA: Diagnosis not present

## 2022-02-12 DIAGNOSIS — L570 Actinic keratosis: Secondary | ICD-10-CM | POA: Diagnosis not present

## 2022-02-12 DIAGNOSIS — L578 Other skin changes due to chronic exposure to nonionizing radiation: Secondary | ICD-10-CM | POA: Diagnosis not present

## 2022-02-12 DIAGNOSIS — Z85828 Personal history of other malignant neoplasm of skin: Secondary | ICD-10-CM | POA: Diagnosis not present

## 2022-06-06 DIAGNOSIS — H0100A Unspecified blepharitis right eye, upper and lower eyelids: Secondary | ICD-10-CM | POA: Diagnosis not present

## 2022-06-06 DIAGNOSIS — H0100B Unspecified blepharitis left eye, upper and lower eyelids: Secondary | ICD-10-CM | POA: Diagnosis not present

## 2022-06-17 DIAGNOSIS — H10413 Chronic giant papillary conjunctivitis, bilateral: Secondary | ICD-10-CM | POA: Diagnosis not present

## 2022-06-25 DIAGNOSIS — L570 Actinic keratosis: Secondary | ICD-10-CM | POA: Diagnosis not present

## 2022-06-25 DIAGNOSIS — L218 Other seborrheic dermatitis: Secondary | ICD-10-CM | POA: Diagnosis not present

## 2022-06-25 DIAGNOSIS — Z85828 Personal history of other malignant neoplasm of skin: Secondary | ICD-10-CM | POA: Diagnosis not present

## 2022-07-11 DIAGNOSIS — Z85828 Personal history of other malignant neoplasm of skin: Secondary | ICD-10-CM | POA: Diagnosis not present

## 2022-07-11 DIAGNOSIS — L239 Allergic contact dermatitis, unspecified cause: Secondary | ICD-10-CM | POA: Diagnosis not present

## 2022-07-18 DIAGNOSIS — L239 Allergic contact dermatitis, unspecified cause: Secondary | ICD-10-CM | POA: Diagnosis not present

## 2022-07-18 DIAGNOSIS — Z85828 Personal history of other malignant neoplasm of skin: Secondary | ICD-10-CM | POA: Diagnosis not present

## 2022-07-29 DIAGNOSIS — B356 Tinea cruris: Secondary | ICD-10-CM | POA: Diagnosis not present

## 2022-08-01 DIAGNOSIS — Z23 Encounter for immunization: Secondary | ICD-10-CM | POA: Diagnosis not present

## 2022-08-06 DIAGNOSIS — L218 Other seborrheic dermatitis: Secondary | ICD-10-CM | POA: Diagnosis not present

## 2022-08-08 DIAGNOSIS — Z961 Presence of intraocular lens: Secondary | ICD-10-CM | POA: Diagnosis not present

## 2022-08-28 DIAGNOSIS — L218 Other seborrheic dermatitis: Secondary | ICD-10-CM | POA: Diagnosis not present

## 2022-09-05 DIAGNOSIS — B974 Respiratory syncytial virus as the cause of diseases classified elsewhere: Secondary | ICD-10-CM | POA: Diagnosis not present

## 2022-09-05 DIAGNOSIS — Z03818 Encounter for observation for suspected exposure to other biological agents ruled out: Secondary | ICD-10-CM | POA: Diagnosis not present

## 2022-09-05 DIAGNOSIS — R059 Cough, unspecified: Secondary | ICD-10-CM | POA: Diagnosis not present

## 2022-11-05 DIAGNOSIS — L309 Dermatitis, unspecified: Secondary | ICD-10-CM | POA: Diagnosis not present

## 2022-11-05 DIAGNOSIS — Z0000 Encounter for general adult medical examination without abnormal findings: Secondary | ICD-10-CM | POA: Diagnosis not present

## 2022-11-05 DIAGNOSIS — M47816 Spondylosis without myelopathy or radiculopathy, lumbar region: Secondary | ICD-10-CM | POA: Diagnosis not present

## 2022-11-05 DIAGNOSIS — I10 Essential (primary) hypertension: Secondary | ICD-10-CM | POA: Diagnosis not present

## 2022-11-05 DIAGNOSIS — E782 Mixed hyperlipidemia: Secondary | ICD-10-CM | POA: Diagnosis not present

## 2022-11-05 DIAGNOSIS — I700 Atherosclerosis of aorta: Secondary | ICD-10-CM | POA: Diagnosis not present

## 2023-01-27 DIAGNOSIS — L821 Other seborrheic keratosis: Secondary | ICD-10-CM | POA: Diagnosis not present

## 2023-01-27 DIAGNOSIS — L538 Other specified erythematous conditions: Secondary | ICD-10-CM | POA: Diagnosis not present

## 2023-01-27 DIAGNOSIS — L298 Other pruritus: Secondary | ICD-10-CM | POA: Diagnosis not present

## 2023-01-27 DIAGNOSIS — R208 Other disturbances of skin sensation: Secondary | ICD-10-CM | POA: Diagnosis not present

## 2023-01-27 DIAGNOSIS — Z789 Other specified health status: Secondary | ICD-10-CM | POA: Diagnosis not present

## 2023-01-27 DIAGNOSIS — L820 Inflamed seborrheic keratosis: Secondary | ICD-10-CM | POA: Diagnosis not present

## 2023-02-11 DIAGNOSIS — I498 Other specified cardiac arrhythmias: Secondary | ICD-10-CM | POA: Diagnosis not present

## 2023-02-11 DIAGNOSIS — R42 Dizziness and giddiness: Secondary | ICD-10-CM | POA: Diagnosis not present

## 2023-02-11 DIAGNOSIS — R55 Syncope and collapse: Secondary | ICD-10-CM | POA: Diagnosis not present

## 2023-02-11 DIAGNOSIS — R531 Weakness: Secondary | ICD-10-CM | POA: Diagnosis not present

## 2023-02-11 DIAGNOSIS — I10 Essential (primary) hypertension: Secondary | ICD-10-CM | POA: Diagnosis not present

## 2023-02-11 DIAGNOSIS — H9190 Unspecified hearing loss, unspecified ear: Secondary | ICD-10-CM | POA: Diagnosis not present

## 2023-02-11 DIAGNOSIS — E782 Mixed hyperlipidemia: Secondary | ICD-10-CM | POA: Diagnosis not present

## 2023-02-25 ENCOUNTER — Ambulatory Visit: Payer: Medicare Other | Attending: Physician Assistant | Admitting: Audiologist

## 2023-02-25 DIAGNOSIS — H903 Sensorineural hearing loss, bilateral: Secondary | ICD-10-CM | POA: Insufficient documentation

## 2023-02-25 NOTE — Procedures (Signed)
  Outpatient Audiology and [facility] [address] [place], [place]  [address] [phone]  AUDIOLOGICAL  EVALUATION  NAME: Miguel Kirby     DOB:   02-27-1946      MRN: [id number]                                                                                     DATE: 02/25/2023     REFERENT: Lupita Raider, MD STATUS: Outpatient DIAGNOSIS: Sensorineural Hearing Loss Bilateral    History: Storm was seen for an audiological evaluation due to difficulty hearing. He struggles to hear people from a distance or in noisy places. Ayal has no history of hazardous noise exposure. No history of pain, pressure, or tinnitus. He has dizziness for which he is being treated. Kamaron had a hearing test many years ago. He feels his current difficulty happened slowly. No other case history reported. No relevant medical history.   Evaluation:  Otoscopy showed a clear view of the tympanic membranes, bilaterally Tympanometry results were consistent with normal middle ear function, bilaterally   Audiometric testing was completed using Conventional Audiometry techniques with supraural headphones. Test results are consistent with normal hearing sloping after 500Hz  to a moderate sensorineural hearing loss bilaterally. Speech Recognition Thresholds were obtained at 40dB HL in the right ear and at 35dB HL in the left ear. Word Recognition Testing was completed at 40dB SL and Destan scored 100% in the right ear and 96% in the left ear.    Results:  The test results were reviewed with Fayrene Fearing. He has a moderate high pitched sensorineural hearing loss due to age related changes. He needs hearing aids for both ears. A List of local providers for hearing aids, OTC options, and two copies of his audiogram were provided to [place]. No follow up with [facility] necessary. Squire was motivated to pursue hearing aids today.   Recommendations: 1.   Hearing aids recommended for both ears. Bring provided copy of  hearing test. See list of local providers.    32 minutes spent testing and counseling on results.   If you have any questions please feel free to contact me at [phone].  Ammie Ferrier Audiologist, Au.D., CCC-A 02/25/2023  11:37 AM  Cc: Lupita Raider, MD

## 2023-03-26 DIAGNOSIS — M79641 Pain in right hand: Secondary | ICD-10-CM | POA: Diagnosis not present

## 2023-03-26 DIAGNOSIS — M65841 Other synovitis and tenosynovitis, right hand: Secondary | ICD-10-CM | POA: Diagnosis not present

## 2023-03-26 DIAGNOSIS — M25641 Stiffness of right hand, not elsewhere classified: Secondary | ICD-10-CM | POA: Diagnosis not present

## 2023-04-06 ENCOUNTER — Ambulatory Visit: Payer: Medicare Other | Attending: Internal Medicine | Admitting: Internal Medicine

## 2023-04-06 ENCOUNTER — Encounter: Payer: Self-pay | Admitting: Internal Medicine

## 2023-04-06 VITALS — BP 114/60 | HR 73 | Ht 69.5 in | Wt 171.0 lb

## 2023-04-06 DIAGNOSIS — I6789 Other cerebrovascular disease: Secondary | ICD-10-CM | POA: Diagnosis not present

## 2023-04-06 DIAGNOSIS — H814 Vertigo of central origin: Secondary | ICD-10-CM | POA: Insufficient documentation

## 2023-04-06 DIAGNOSIS — R55 Syncope and collapse: Secondary | ICD-10-CM | POA: Diagnosis not present

## 2023-04-06 DIAGNOSIS — R42 Dizziness and giddiness: Secondary | ICD-10-CM | POA: Insufficient documentation

## 2023-04-06 NOTE — Progress Notes (Signed)
Cardiology Office Note:    Date:  04/06/2023   ID:  Miguel Kirby, DOB Feb 26, 1946, MRN [id number]  PCP:  Lupita Raider, MD   [facility] Providers Cardiologist:  None     Referring MD: Milus Height, PA   No chief complaint on file. Dizziness  History of Present Illness:    Miguel Kirby is a 77 y.o. male with no hx of cardiac dx who was referred for dizziness - Onset: 6 weeks ago - Symptoms: Initially c/f vertigo, he tried meclizine which did not help - Duration: Constant throughout the day. He will get sudden feeling of dizziness - Severity: Severe enough to cause difficulty walking - Course: Today he woke up with vertigo, improved to dizziness He has not seen a neurologist. Prior exercise SPECT; 1mm ST downsloping inferolateral ST depression; had a small mild fixed defect in this territory. Deemed intermediate risk study. Echo was unremarkable.  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 left bridge of nose tx MOHS   BCC (basal cell carcinoma of skin) 12/09/2018   bcc nod. 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   right pariatial scalp superior in situ (cx26fu)   SCCA (squamous cell carcinoma) of skin 02/25/2016   scc in situ outer corner left eye tx fluorouracil cream   SCCA (squamous cell carcinoma) of skin 05/13/2018   scc in situ right cheek tx cx3 10fu   SCCA (squamous cell carcinoma) of skin 11/01/2018   scc in situ left scalp tx tolak @ f/u   Trigger finger    multiple    Past Surgical History:  Procedure Laterality Date   TONSILLECTOMY     TOTAL KNEE ARTHROPLASTY Right 1986    Current Medications: Current Outpatient Medications on File Prior to Visit  Medication Sig  Dispense Refill   amLODipine (NORVASC) 5 MG tablet Take 1 tablet by mouth daily.     cetirizine (ZYRTEC) 10 MG tablet Take 10 mg by mouth daily.     COVID-19 mRNA bivalent vaccine, Pfizer, (PFIZER COVID-19 VAC BIVALENT) injection Inject into the muscle. 0.3 mL 0   hydrochlorothiazide (HYDRODIURIL) 25 MG tablet Take 25 mg by mouth daily.     losartan-hydrochlorothiazide (HYZAAR) 100-25 MG tablet Take 1 tablet by mouth daily.     predniSONE (DELTASONE) 10 MG tablet Take 10 mg by mouth daily. PATIENT TAKING 2 TABLETS DAILY FOR 3 WEEKS.     simvastatin (ZOCOR) 10 MG tablet Take 1 tablet by mouth daily.     No current facility-administered medications on file prior to visit.     Allergies:   Patient has no known allergies.   Social History   Socioeconomic History   Marital status: Married    Spouse name: Not on file   Number of children: Not on file   Years of education: Not on file   Highest education level: Not on file  Occupational History   Not on file  Tobacco Use   Smoking status: Former   Smokeless tobacco: Former   Tobacco comments:    quit 1989  Substance and Sexual Activity   Alcohol use: Yes  Alcohol/week: 10.0 standard drinks of alcohol    Types: 10 Standard drinks or equivalent per week   Drug use: Never   Sexual activity: Not on file  Other Topics Concern   Not on file  Social History Narrative   Not on file   Social Determinants of Health   Financial Resource Strain: Not on file  Food Insecurity: Not on file  Transportation Needs: Not on file  Physical Activity: Not on file  Stress: Not on file  Social Connections: Not on file     Family History: The patient's family history includes Congestive Heart Failure in his father; Coronary artery disease in his mother; Hypertension in his father. There is no history of Other.  ROS:   Please see the history of present illness.     All other systems reviewed and are negative.  EKGs/Labs/Other Studies  Reviewed:    The following studies were reviewed today: EKG Interpretation Date/Time:  Monday April 06 2023 13:36:40 EDT Ventricular Rate:  73 PR Interval:  142 QRS Duration:  88 QT Interval:  404 QTC Calculation: 445 R Axis:   78  Text Interpretation: Sinus rhythm with sinus arrhythmia with frequent Premature ventricular complexes Nonspecific ST abnormality No previous ECGs available Confirmed by Carolan Clines ([id number]) on 04/06/2023 1:43:15 PM       Recent Labs: No results found for requested labs within last 365 days.  Recent Lipid Panel No results found for: "CHOL", "TRIG", "HDL", "CHOLHDL", "VLDL", "LDLCALC", "LDLDIRECT"   Risk Assessment/Calculations:    Physical Exam:    VS:   Vitals:   04/06/23 1330  BP: 114/60  Pulse: 73  SpO2: 95%     Wt Readings from Last 3 Encounters:  08/21/15 173 lb (78.5 kg)  07/23/15 173 lb 1.9 oz (78.5 kg)     GEN:  Well nourished, well developed in no acute distress HEENT: Normal NECK: No JVD; No carotid bruits LYMPHATICS: No lymphadenopathy CARDIAC: RRR, no murmurs, rubs, gallops RESPIRATORY:  Clear to auscultation without rales, wheezing or rhonchi  ABDOMEN: Soft, non-tender, non-distended MUSCULOSKELETAL:  No edema; No deformity  SKIN: Warm and dry NEUROLOGIC:  Alert and oriented x 3 PSYCHIATRIC:  Normal affect   ASSESSMENT:   Dizziness - ddx is broad. Symptoms c/f vertigo - Ddx also includes stroke, afib, AS considering his age. No signs of this so far, will get further testing PLAN:    In order of problems listed above:  MRA head and neck TTE 7 day ziopatch Also recommended improving hydration Follow up PRN  Medication Adjustments/Labs and Tests Ordered: Current medicines are reviewed at length with the patient today.  Concerns regarding medicines are outlined above.  No orders of the defined types were placed in this encounter.  No orders of the defined types were placed in this encounter.   There are no  Patient Instructions on file for this visit.   Signed, Maisie Fus, MD  04/06/2023 7:51 AM    [facility]

## 2023-04-06 NOTE — Patient Instructions (Addendum)
Medication Instructions:  Your physician recommends that you continue on your current medications as directed. Please refer to the Current Medication list given to you today.  *If you need a refill on your cardiac medications before your next appointment, please call your pharmacy*   Lab Work: None needed  If you have labs (blood work) drawn today and your tests are completely normal, you will receive your results only by: MyChart Message (if you have MyChart) OR A paper copy in the mail If you have any lab test that is abnormal or we need to change your treatment, we will call you to review the results.   Testing/Procedures: Your physician has requested that you have an echocardiogram. Echocardiography is a painless test that uses sound waves to create images of your heart. It provides your doctor with information about the size and shape of your heart and how well your heart's chambers and valves are working. This procedure takes approximately one hour. There are no restrictions for this procedure. Please do NOT wear cologne, perfume, aftershave, or lotions (deodorant is allowed). Please arrive 15 minutes prior to your appointment time.  ZIO XT- Long Term Monitor Instructions  Your physician has requested you wear a ZIO patch monitor for 7 days.  This is a single patch monitor. [employer] supplies one patch monitor per enrollment. Additional stickers are not available. Please do not apply patch if you will be having a Nuclear Stress Test,  Echocardiogram, Cardiac CT, MRI, or Chest Xray during the period you would be wearing the  monitor. The patch cannot be worn during these tests. You cannot remove and re-apply the  ZIO XT patch monitor.  Your ZIO patch monitor will be mailed 3 day USPS to your address on file. It may take 3-5 days  to receive your monitor after you have been enrolled.  Once you have received your monitor, please review the enclosed instructions. Your monitor  has  already been registered assigning a specific monitor serial # to you.  Billing and Patient Assistance Program Information  We have supplied [employer] with any of your insurance information on file for billing purposes. [employer] offers a sliding scale Patient Assistance Program for patients that do not have  insurance, or whose insurance does not completely cover the cost of the ZIO monitor.  You must apply for the Patient Assistance Program to qualify for this discounted rate.  To apply, please call [employer] at [phone], select option 4, select option 2, ask to apply for  Patient Assistance Program. Meredeth Ide will ask your household income, and how many people  are in your household. They will quote your out-of-pocket cost based on that information.  [employer] will also be able to set up a 84-month, interest-free payment plan if needed.  Applying the monitor   Shave hair from upper left chest.  Hold abrader disc by orange tab. Rub abrader in 40 strokes over the upper left chest as  indicated in your monitor instructions.  Clean area with 4 enclosed alcohol pads. Let dry.  Apply patch as indicated in monitor instructions. Patch will be placed under collarbone on left  side of chest with arrow pointing upward.  Rub patch adhesive wings for 2 minutes. Remove white label marked "1". Remove the white  label marked "2". Rub patch adhesive wings for 2 additional minutes.  While looking in a mirror, press and release button in center of patch. A small green light will  flash 3-4 times. This will be your only  indicator that the monitor has been turned on.  Do not shower for the first 24 hours. You may shower after the first 24 hours.  Press the button if you feel a symptom. You will hear a small click. Record Date, Time and  Symptom in the Patient Logbook.  When you are ready to remove the patch, follow instructions on the last 2 pages of Patient  Logbook. Stick patch monitor onto the last page of  Patient Logbook.  Place Patient Logbook in the blue and white box. Use locking tab on box and tape box closed  securely. The blue and white box has prepaid postage on it. Please place it in the mailbox as  soon as possible. Your physician should have your test results approximately 7 days after the  monitor has been mailed back to [facility].  Call [facility] Customer Care at [phone] if you have questions regarding  your ZIO XT patch monitor. Call them immediately if you see an orange light blinking on your  monitor.  If your monitor falls off in less than 4 days, contact our Monitor department at [phone].  If your monitor becomes loose or falls off after 4 days call [employer] at [phone] for  suggestions on securing your monitor   Follow-Up: At [facility], you and your health needs are our priority.  As part of our continuing mission to provide you with exceptional heart care, we have created designated Provider Care Teams.  These Care Teams include your primary Cardiologist (physician) and Advanced Practice Providers (APPs -  Physician Assistants and Nurse Practitioners) who all work together to provide you with the care you need, when you need it.    Your next appointment:   F/u as needed pending results she may want to see you sooner  Provider:   Maisie Fus, MD    Other Instructions   You are scheduled for Head/Neck MRI on ______________. Please arrive for your appointment at ______________ ( arrive 30-45 minutes prior to test start time). ?  [facility] [address] [place], [place] [address] [phone] Please take advantage of the free valet parking available at the [facility] and [employer] (Entrance C).  Proceed to the [facility] Radiology Department (First Floor) for check-in.      Magnetic resonance imaging (MRI) is a painless test that produces images of the inside of the body without using Xrays.  During  an MRI, strong magnets and radio waves work together in a Data processing manager to form detailed images.   MRI images may provide more details about a medical condition than X-rays, CT scans, and ultrasounds can provide.  You may be given earphones to listen for instructions.  You may eat a light breakfast and take medications as ordered with the exception of furosemide, hydrochlorothiazide, or spironolactone(fluid pill, other).   If a contrast material will be used, an IV will be inserted into one of your veins. Contrast material will be injected into your IV. It will leave your body through your urine within a day. You may be told to drink plenty of fluids to help flush the contrast material out of your system.  You will be asked to remove all metal, including: Watch, jewelry, and other metal objects including hearing aids, hair pieces and dentures. Also wearable glucose monitoring systems (ie. Freestyle Libre and Omnipods) (Braces and fillings normally are not a problem.)   TEST WILL TAKE APPROXIMATELY 1 HOUR  PLEASE NOTIFY SCHEDULING AT LEAST  24 HOURS IN ADVANCE IF YOU ARE UNABLE TO KEEP YOUR APPOINTMENT. [phone]

## 2023-04-07 ENCOUNTER — Ambulatory Visit: Payer: Medicare Other | Attending: Internal Medicine

## 2023-04-07 DIAGNOSIS — R42 Dizziness and giddiness: Secondary | ICD-10-CM

## 2023-04-07 DIAGNOSIS — H814 Vertigo of central origin: Secondary | ICD-10-CM

## 2023-04-07 NOTE — Progress Notes (Unsigned)
Enrolled patient for a 7 day Zio XT monitor to be mailed to patients home.  

## 2023-04-10 DIAGNOSIS — H814 Vertigo of central origin: Secondary | ICD-10-CM

## 2023-04-10 DIAGNOSIS — R55 Syncope and collapse: Secondary | ICD-10-CM

## 2023-04-13 ENCOUNTER — Telehealth: Payer: Self-pay | Admitting: Internal Medicine

## 2023-04-13 NOTE — Telephone Encounter (Signed)
Patient is requesting for [place] to send in a prescription to help calm his anxiety for his scan scheduled for 08/09. Please advise.

## 2023-04-13 NOTE — Telephone Encounter (Signed)
Patient would like to know if he can get something for his anxiety when he have his MRI on Friday?

## 2023-04-15 ENCOUNTER — Other Ambulatory Visit: Payer: Self-pay | Admitting: Internal Medicine

## 2023-04-16 ENCOUNTER — Other Ambulatory Visit: Payer: Self-pay | Admitting: Internal Medicine

## 2023-04-16 DIAGNOSIS — F419 Anxiety disorder, unspecified: Secondary | ICD-10-CM

## 2023-04-16 NOTE — Telephone Encounter (Signed)
Patient is aware of provider message and verbalized understanding.

## 2023-04-17 ENCOUNTER — Ambulatory Visit (HOSPITAL_COMMUNITY)
Admission: RE | Admit: 2023-04-17 | Discharge: 2023-04-17 | Disposition: A | Discharge status: Home / Self Care | Payer: Medicare Other | Source: Ambulatory Visit | Attending: Internal Medicine | Admitting: Internal Medicine

## 2023-04-17 DIAGNOSIS — I6789 Other cerebrovascular disease: Secondary | ICD-10-CM

## 2023-04-17 DIAGNOSIS — H814 Vertigo of central origin: Secondary | ICD-10-CM

## 2023-04-17 DIAGNOSIS — R42 Dizziness and giddiness: Secondary | ICD-10-CM | POA: Diagnosis not present

## 2023-04-22 DIAGNOSIS — H814 Vertigo of central origin: Secondary | ICD-10-CM | POA: Diagnosis not present

## 2023-04-22 DIAGNOSIS — R42 Dizziness and giddiness: Secondary | ICD-10-CM | POA: Diagnosis not present

## 2023-04-23 ENCOUNTER — Other Ambulatory Visit: Payer: Self-pay | Admitting: Internal Medicine

## 2023-04-23 DIAGNOSIS — I493 Ventricular premature depolarization: Secondary | ICD-10-CM

## 2023-04-23 MED ORDER — METOPROLOL SUCCINATE ER 25 MG PO TB24: 25.0000 mg | ORAL_TABLET | Freq: Every day | ORAL | 3 refills | Status: DC

## 2023-04-27 ENCOUNTER — Encounter (HOSPITAL_COMMUNITY): Payer: Self-pay | Admitting: Internal Medicine

## 2023-04-27 ENCOUNTER — Other Ambulatory Visit: Payer: Self-pay

## 2023-04-27 ENCOUNTER — Ambulatory Visit (HOSPITAL_COMMUNITY): Payer: Medicare Other | Attending: Cardiology

## 2023-04-27 DIAGNOSIS — I6789 Other cerebrovascular disease: Secondary | ICD-10-CM | POA: Insufficient documentation

## 2023-04-27 DIAGNOSIS — R9431 Abnormal electrocardiogram [ECG] [EKG]: Secondary | ICD-10-CM

## 2023-04-27 DIAGNOSIS — R42 Dizziness and giddiness: Secondary | ICD-10-CM

## 2023-04-27 DIAGNOSIS — H814 Vertigo of central origin: Secondary | ICD-10-CM | POA: Diagnosis not present

## 2023-04-27 LAB — ECHOCARDIOGRAM COMPLETE
Area-P 1/2: 3.2 cm2
S' Lateral: 2.5 cm

## 2023-04-28 ENCOUNTER — Encounter (HOSPITAL_COMMUNITY): Payer: Self-pay

## 2023-04-30 ENCOUNTER — Ambulatory Visit (HOSPITAL_COMMUNITY): Payer: Medicare Other | Attending: Internal Medicine

## 2023-04-30 DIAGNOSIS — R9431 Abnormal electrocardiogram [ECG] [EKG]: Secondary | ICD-10-CM | POA: Diagnosis not present

## 2023-04-30 DIAGNOSIS — R42 Dizziness and giddiness: Secondary | ICD-10-CM | POA: Diagnosis not present

## 2023-04-30 LAB — MYOCARDIAL PERFUSION IMAGING
LV dias vol: 62 mL (ref 62–150)
LV sys vol: 17 mL
Nuc Stress EF: 72 %
Peak HR: 83 {beats}/min
Rest HR: 58 {beats}/min
Rest Nuclear Isotope Dose: 11 mCi
SDS: 0
SRS: 0
SSS: 0
ST Depression (mm): 0 mm
Stress Nuclear Isotope Dose: 31.9 mCi
TID: 0.95

## 2023-04-30 MED ORDER — TECHNETIUM TC 99M TETROFOSMIN IV KIT
31.9000 | PACK | Freq: Once | INTRAVENOUS | Status: AC | PRN
  Administered 2023-04-30: 31.9 via INTRAVENOUS

## 2023-04-30 MED ORDER — TECHNETIUM TC 99M TETROFOSMIN IV KIT
11.0000 | PACK | Freq: Once | INTRAVENOUS | Status: AC | PRN
  Administered 2023-04-30: 11 via INTRAVENOUS

## 2023-04-30 MED ORDER — REGADENOSON 0.4 MG/5ML IV SOLN
0.4000 mg | Freq: Once | INTRAVENOUS | Status: AC
  Administered 2023-04-30: 0.4 mg via INTRAVENOUS

## 2023-05-21 DIAGNOSIS — Z23 Encounter for immunization: Secondary | ICD-10-CM | POA: Diagnosis not present

## 2023-05-28 DIAGNOSIS — M65841 Other synovitis and tenosynovitis, right hand: Secondary | ICD-10-CM | POA: Diagnosis not present

## 2023-05-28 DIAGNOSIS — M25511 Pain in right shoulder: Secondary | ICD-10-CM | POA: Diagnosis not present

## 2023-05-28 DIAGNOSIS — M65341 Trigger finger, right ring finger: Secondary | ICD-10-CM | POA: Diagnosis not present

## 2023-09-25 DIAGNOSIS — M65341 Trigger finger, right ring finger: Secondary | ICD-10-CM | POA: Diagnosis not present

## 2023-10-06 DIAGNOSIS — Z23 Encounter for immunization: Secondary | ICD-10-CM | POA: Diagnosis not present

## 2023-10-08 DIAGNOSIS — H01115 Allergic dermatitis of left lower eyelid: Secondary | ICD-10-CM | POA: Diagnosis not present

## 2023-10-08 DIAGNOSIS — H01112 Allergic dermatitis of right lower eyelid: Secondary | ICD-10-CM | POA: Diagnosis not present

## 2023-10-08 DIAGNOSIS — H01114 Allergic dermatitis of left upper eyelid: Secondary | ICD-10-CM | POA: Diagnosis not present

## 2023-10-08 DIAGNOSIS — H01111 Allergic dermatitis of right upper eyelid: Secondary | ICD-10-CM | POA: Diagnosis not present

## 2023-10-08 DIAGNOSIS — H0100B Unspecified blepharitis left eye, upper and lower eyelids: Secondary | ICD-10-CM | POA: Diagnosis not present

## 2023-10-08 DIAGNOSIS — H0100A Unspecified blepharitis right eye, upper and lower eyelids: Secondary | ICD-10-CM | POA: Diagnosis not present

## 2023-10-08 DIAGNOSIS — H10503 Unspecified blepharoconjunctivitis, bilateral: Secondary | ICD-10-CM | POA: Diagnosis not present

## 2023-10-15 DIAGNOSIS — H01114 Allergic dermatitis of left upper eyelid: Secondary | ICD-10-CM | POA: Diagnosis not present

## 2023-10-15 DIAGNOSIS — H01115 Allergic dermatitis of left lower eyelid: Secondary | ICD-10-CM | POA: Diagnosis not present

## 2023-10-15 DIAGNOSIS — H0100B Unspecified blepharitis left eye, upper and lower eyelids: Secondary | ICD-10-CM | POA: Diagnosis not present

## 2023-10-15 DIAGNOSIS — H01111 Allergic dermatitis of right upper eyelid: Secondary | ICD-10-CM | POA: Diagnosis not present

## 2023-10-15 DIAGNOSIS — H0100A Unspecified blepharitis right eye, upper and lower eyelids: Secondary | ICD-10-CM | POA: Diagnosis not present

## 2023-10-15 DIAGNOSIS — H01112 Allergic dermatitis of right lower eyelid: Secondary | ICD-10-CM | POA: Diagnosis not present

## 2023-11-09 DIAGNOSIS — M25511 Pain in right shoulder: Secondary | ICD-10-CM | POA: Diagnosis not present

## 2023-11-09 DIAGNOSIS — M65341 Trigger finger, right ring finger: Secondary | ICD-10-CM | POA: Diagnosis not present

## 2023-11-09 DIAGNOSIS — M79641 Pain in right hand: Secondary | ICD-10-CM | POA: Diagnosis not present

## 2023-11-09 DIAGNOSIS — M65841 Other synovitis and tenosynovitis, right hand: Secondary | ICD-10-CM | POA: Diagnosis not present

## 2023-11-17 DIAGNOSIS — R0600 Dyspnea, unspecified: Secondary | ICD-10-CM | POA: Diagnosis not present

## 2023-11-17 DIAGNOSIS — I700 Atherosclerosis of aorta: Secondary | ICD-10-CM | POA: Diagnosis not present

## 2023-11-17 DIAGNOSIS — E782 Mixed hyperlipidemia: Secondary | ICD-10-CM | POA: Diagnosis not present

## 2023-11-17 DIAGNOSIS — Z131 Encounter for screening for diabetes mellitus: Secondary | ICD-10-CM | POA: Diagnosis not present

## 2023-11-17 DIAGNOSIS — M47816 Spondylosis without myelopathy or radiculopathy, lumbar region: Secondary | ICD-10-CM | POA: Diagnosis not present

## 2023-11-17 DIAGNOSIS — I10 Essential (primary) hypertension: Secondary | ICD-10-CM | POA: Diagnosis not present

## 2023-11-17 DIAGNOSIS — L304 Erythema intertrigo: Secondary | ICD-10-CM | POA: Diagnosis not present

## 2023-11-17 DIAGNOSIS — Z23 Encounter for immunization: Secondary | ICD-10-CM | POA: Diagnosis not present

## 2023-11-17 DIAGNOSIS — Z0000 Encounter for general adult medical examination without abnormal findings: Secondary | ICD-10-CM | POA: Diagnosis not present

## 2023-12-07 DIAGNOSIS — E782 Mixed hyperlipidemia: Secondary | ICD-10-CM | POA: Diagnosis not present

## 2023-12-07 DIAGNOSIS — I700 Atherosclerosis of aorta: Secondary | ICD-10-CM | POA: Diagnosis not present

## 2023-12-07 DIAGNOSIS — I10 Essential (primary) hypertension: Secondary | ICD-10-CM | POA: Diagnosis not present

## 2023-12-07 DIAGNOSIS — M47816 Spondylosis without myelopathy or radiculopathy, lumbar region: Secondary | ICD-10-CM | POA: Diagnosis not present

## 2023-12-14 DIAGNOSIS — M65341 Trigger finger, right ring finger: Secondary | ICD-10-CM | POA: Diagnosis not present

## 2023-12-24 ENCOUNTER — Ambulatory Visit
Admission: RE | Admit: 2023-12-24 | Discharge: 2023-12-24 | Disposition: A | Discharge status: Home / Self Care | Source: Ambulatory Visit | Attending: Family Medicine | Admitting: Family Medicine

## 2023-12-24 ENCOUNTER — Other Ambulatory Visit: Payer: Self-pay | Admitting: Family Medicine

## 2023-12-24 DIAGNOSIS — R0602 Shortness of breath: Secondary | ICD-10-CM

## 2023-12-28 DIAGNOSIS — M25641 Stiffness of right hand, not elsewhere classified: Secondary | ICD-10-CM | POA: Diagnosis not present

## 2024-01-06 DIAGNOSIS — M47816 Spondylosis without myelopathy or radiculopathy, lumbar region: Secondary | ICD-10-CM | POA: Diagnosis not present

## 2024-01-06 DIAGNOSIS — I10 Essential (primary) hypertension: Secondary | ICD-10-CM | POA: Diagnosis not present

## 2024-01-06 DIAGNOSIS — E782 Mixed hyperlipidemia: Secondary | ICD-10-CM | POA: Diagnosis not present

## 2024-01-06 DIAGNOSIS — I700 Atherosclerosis of aorta: Secondary | ICD-10-CM | POA: Diagnosis not present

## 2024-01-13 DIAGNOSIS — I700 Atherosclerosis of aorta: Secondary | ICD-10-CM | POA: Diagnosis not present

## 2024-01-13 DIAGNOSIS — I10 Essential (primary) hypertension: Secondary | ICD-10-CM | POA: Diagnosis not present

## 2024-01-29 ENCOUNTER — Encounter: Payer: Self-pay | Admitting: Pulmonary Disease

## 2024-01-29 ENCOUNTER — Ambulatory Visit: Admitting: Pulmonary Disease

## 2024-01-29 VITALS — BP 103/67 | HR 61 | Ht 70.0 in | Wt 164.8 lb

## 2024-01-29 DIAGNOSIS — R0602 Shortness of breath: Secondary | ICD-10-CM | POA: Diagnosis not present

## 2024-01-29 LAB — CBC WITH DIFFERENTIAL/PLATELET
Basophils Absolute: 0 10*3/uL (ref 0.0–0.1)
Basophils Relative: 1 % (ref 0.0–3.0)
Eosinophils Absolute: 0.2 10*3/uL (ref 0.0–0.7)
Eosinophils Relative: 4.2 % (ref 0.0–5.0)
HCT: 40.6 % (ref 39.0–52.0)
Hemoglobin: 14 g/dL (ref 13.0–17.0)
Lymphocytes Relative: 24.8 % (ref 12.0–46.0)
Lymphs Abs: 1.1 10*3/uL (ref 0.7–4.0)
MCHC: 34.4 g/dL (ref 30.0–36.0)
MCV: 86.8 fl (ref 78.0–100.0)
Monocytes Absolute: 0.4 10*3/uL (ref 0.1–1.0)
Monocytes Relative: 8.8 % (ref 3.0–12.0)
Neutro Abs: 2.8 10*3/uL (ref 1.4–7.7)
Neutrophils Relative %: 61.2 % (ref 43.0–77.0)
Platelets: 201 10*3/uL (ref 150.0–400.0)
RBC: 4.68 Mil/uL (ref 4.22–5.81)
RDW: 13.3 % (ref 11.5–15.5)
WBC: 4.6 10*3/uL (ref 4.0–10.5)

## 2024-01-29 LAB — NITRIC OXIDE: Nitric Oxide: 24

## 2024-01-29 MED ORDER — BUDESONIDE-FORMOTEROL FUMARATE 160-4.5 MCG/ACT IN AERO: 2.0000 | INHALATION_SPRAY | Freq: Two times a day (BID) | RESPIRATORY_TRACT | 12 refills | Status: AC

## 2024-01-29 NOTE — Progress Notes (Signed)
 Carla Whilden    [id number]    Jun 12, 1946  Primary Care Physician:Shaw, Jerlean Mood, MD  Referring Physician: Glena Landau, MD [address] [place],  [place] [address]  Chief complaint: Consult for dyspnea  HPI: 78 y.o. 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  Discussed the use of AI scribe software for clinical note transcription with the patient, who gave verbal consent to proceed.  History of Present Illness Harim Bi is an 78 year old male with hypertension who presents with shortness of breath. He was referred by his medical doctor for evaluation of his respiratory symptoms.  He experiences shortness of breath primarily in the mornings, describing it as a sensation of not getting enough oxygen. This is accompanied by a dry cough and wheezing. Symptoms typically improve after one to two hours, allowing him to function normally for the rest of the day. He has adjusted his routine to accommodate this, waking up early to manage his symptoms before starting his day.  The symptoms began approximately six months ago and have progressively worsened. He has not used any inhalers previously and has not been diagnosed with COPD or asthma. A recent chest x-ray showed mild chronic changes, described as atelectasis.  His past medical history is significant for hypertension. He quit smoking in 1990 after smoking two packs a day for about thirty years. No history of seasonal allergies, significant mucus production, or  exposure to occupational hazards such as asbestos or silica. He occasionally experiences acid reflux.   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 Exposures:Denies any exposure to mold, hot tubs, jacuzzis, or feather bedding No h/o chemo/XRT/amiodarone/macrodantin/MTX  No exposure to asbestos, silica or other organic allergens  Smoking history: 60-pack-year smoker.  Quit in 1990 Travel history: No significant travel history Relevant family history: No history of lung disease   Outpatient Encounter Medications as of 01/29/2024  Medication Sig   amLODipine (NORVASC) 5 MG tablet Take 1 tablet by mouth daily.   cetirizine (ZYRTEC) 10 MG tablet Take 10 mg by mouth daily.   COVID-19 mRNA bivalent vaccine, Pfizer, (PFIZER COVID-19 VAC BIVALENT) injection Inject into the muscle.   hydrochlorothiazide (HYDRODIURIL) 25 MG tablet Take 25 mg by mouth daily.   losartan-hydrochlorothiazide (HYZAAR) 100-25 MG tablet Take 1 tablet by mouth daily.   predniSONE (DELTASONE) 10 MG tablet Take 10 mg by mouth daily. PATIENT TAKING 2 TABLETS DAILY FOR 3 WEEKS.   simvastatin (ZOCOR) 10 MG tablet Take 1 tablet by mouth daily.   metoprolol  succinate (TOPROL -XL) 25 MG 24 hr tablet Take 1 tablet (25 mg total) by mouth daily. Take with or immediately following a meal.   No facility-administered encounter medications on file as of 01/29/2024.    Allergies as of 01/29/2024   (No Known Allergies)    Past Medical History:  Diagnosis Date   BCC (basal cell carcinoma of skin) 10/02/2016   bcc nod. left bridge of nose tx MOHS   BCC (basal cell carcinoma of skin) 12/09/2018   bcc nod.  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   MM in situ left forehead TX MOHS   SCC (squamous cell  carcinoma) 12/22/2019   right pariatial scalp superior in situ ([id number])   SCCA (squamous cell carcinoma) of skin 02/25/2016   scc in situ outer corner left eye tx fluorouracil cream   SCCA (squamous cell carcinoma) of skin 05/13/2018   scc in situ right cheek tx cx3 96fu   SCCA (squamous cell carcinoma) of skin 11/01/2018   scc in situ left scalp tx [place] @ f/u   Trigger finger    multiple    Past Surgical History:  Procedure Laterality Date   TONSILLECTOMY     TOTAL KNEE ARTHROPLASTY Right 1986    Family History  Problem Relation Age of Onset   Hypertension Father    Congestive Heart Failure Father    Coronary artery disease Mother    Other Neg Hx        no GI Family History    Social History   Socioeconomic History   Marital status: Married    Spouse name: Not on file   Number of children: Not on file   Years of education: Not on file   Highest education level: Not on file  Occupational History   Not on file  Tobacco Use   Smoking status: Former   Smokeless tobacco: Former   Tobacco comments:    quit 1989  Substance and Sexual Activity   Alcohol use: Yes    Alcohol/week: 10.0 standard drinks of alcohol    Types: 10 Standard drinks or equivalent per week   Drug use: Never   Sexual activity: Not on file  Other Topics Concern   Not on file  Social History Narrative   Not on file   Social Drivers of Health   Financial Resource Strain: Not on file  Food Insecurity: Low Risk  (09/25/2023)   Received from [facility]   Hunger Vital Sign    Worried About Running Out of Food in the Last Year: Never true    Ran Out of Food in the Last Year: Never true  Transportation Needs: No Transportation Needs (09/25/2023)   Received from [facility]    In the past 12 months, has lack of reliable transportation kept you from medical appointments, meetings, work or from getting things needed for daily living? : No  Physical Activity: Not on file  Stress:  Not on file  Social Connections: Not on file  Intimate Partner Violence: Not on file    Review of systems: Review of Systems  Constitutional: Negative for fever and chills.  HENT: Negative.   Eyes: Negative for blurred vision.  Respiratory: as per HPI  Cardiovascular: Negative for chest pain and palpitations.  Gastrointestinal: Negative for vomiting, diarrhea, blood per rectum. Genitourinary: Negative for dysuria, urgency, frequency and hematuria.  Musculoskeletal: Negative for myalgias, back pain and joint pain.  Skin: Negative for itching and rash.  Neurological: Negative for dizziness, tremors, focal weakness, seizures and loss of consciousness.  Endo/Heme/Allergies: Negative for environmental allergies.  Psychiatric/Behavioral: Negative for depression, suicidal ideas and hallucinations.  All other systems reviewed and are negative.  Physical Exam: Blood pressure 103/67, pulse 61, height 5\' 10"  (1.778 m), weight 164 lb 12.8 oz (74.8 kg),  SpO2 96%. Gen:      No acute distress HEENT:  EOMI, sclera anicteric Neck:     No masses; no thyromegaly Lungs:    Clear to auscultation bilaterally; normal respiratory effort CV:         Regular rate and rhythm; no murmurs Abd:      + bowel sounds; soft, non-tender; no palpable masses, no distension Ext:    No edema; adequate peripheral perfusion Skin:      Warm and dry; no rash Neuro: alert and oriented x 3 Psych: normal mood and affect  Data Reviewed: Imaging: CT chest 09/30/2018-lingular scarring, atelectasis Chest x-ray 12/24/2023-chronic changes in the lingula. I have reviewed the images personally.  PFTs:  FENO 01/29/2024- 24  Labs:  Assessment and Plan Assessment & Plan Shortness of breath with wheezing Presents with morning shortness of breath and wheezing, improving after a few hours, worsening over six months. Ceased smoking in 1990, no prior COPD or asthma diagnosis. Differential includes COPD due to smoking history and  possible late-onset asthma.  Prior CT and recent chest x-ray shows mild chronic changes with atelectasis, no significant findings. Lungs clear on examination. Condition considered benign and not life-threatening. Pulmonary function test and blood tests for inflammation from asthma or allergies will provide further diagnostic clarity.  - Order blood tests for inflammation from asthma or allergies. - Perform pulmonary function test to evaluate lung function and assess for asthma or COPD. - Prescribe Symbicort inhaler, two puffs twice daily, to manage symptoms and assess response.  Recommendations: Check CBC, IgE PFTs Start Symbicort  Fredia Chittenden MD [facility] 01/29/2024, 9:30 AM  CC: Glena Landau, MD

## 2024-01-29 NOTE — Patient Instructions (Signed)
 VISIT SUMMARY:  Today, you were seen for your shortness of breath and wheezing, which have been worsening over the past six months. We discussed your symptoms, medical history, and lifestyle. A chest x-ray showed mild chronic changes, but your lungs were clear on examination. We also reviewed your hypertension, which remains stable.  YOUR PLAN:  -SHORTNESS OF BREATH WITH WHEEZING: Your shortness of breath and wheezing, especially in the mornings, may be due to chronic obstructive pulmonary disease (COPD) or late-onset asthma. COPD is a lung condition often caused by smoking that makes it hard to breathe. Asthma is a condition where your airways narrow and swell, producing extra mucus. We will perform blood tests to check for inflammation from asthma or allergies and a pulmonary function test to evaluate your lung function. You have been prescribed a Symbicort inhaler, which you should use two puffs twice daily to help manage your symptoms and see how you respond.  INSTRUCTIONS:  Please complete the blood tests and pulmonary function test as soon as possible. Use the Symbicort inhaler as prescribed, two puffs twice daily. Follow up with us  after completing the tests to discuss the results and any further steps.

## 2024-02-05 LAB — IGE: IgE (Immunoglobulin E), Serum: 2 kU/L (ref <=114)

## 2024-02-06 DIAGNOSIS — I10 Essential (primary) hypertension: Secondary | ICD-10-CM | POA: Diagnosis not present

## 2024-02-06 DIAGNOSIS — I700 Atherosclerosis of aorta: Secondary | ICD-10-CM | POA: Diagnosis not present

## 2024-02-06 DIAGNOSIS — M47816 Spondylosis without myelopathy or radiculopathy, lumbar region: Secondary | ICD-10-CM | POA: Diagnosis not present

## 2024-02-06 DIAGNOSIS — E782 Mixed hyperlipidemia: Secondary | ICD-10-CM | POA: Diagnosis not present

## 2024-02-08 DIAGNOSIS — D225 Melanocytic nevi of trunk: Secondary | ICD-10-CM | POA: Diagnosis not present

## 2024-02-08 DIAGNOSIS — L570 Actinic keratosis: Secondary | ICD-10-CM | POA: Diagnosis not present

## 2024-02-08 DIAGNOSIS — L814 Other melanin hyperpigmentation: Secondary | ICD-10-CM | POA: Diagnosis not present

## 2024-02-08 DIAGNOSIS — L821 Other seborrheic keratosis: Secondary | ICD-10-CM | POA: Diagnosis not present

## 2024-02-08 DIAGNOSIS — L218 Other seborrheic dermatitis: Secondary | ICD-10-CM | POA: Diagnosis not present

## 2024-02-12 DIAGNOSIS — I700 Atherosclerosis of aorta: Secondary | ICD-10-CM | POA: Diagnosis not present

## 2024-02-12 DIAGNOSIS — I10 Essential (primary) hypertension: Secondary | ICD-10-CM | POA: Diagnosis not present

## 2024-02-17 ENCOUNTER — Ambulatory Visit: Payer: Self-pay | Admitting: Pulmonary Disease

## 2024-02-18 DIAGNOSIS — I10 Essential (primary) hypertension: Secondary | ICD-10-CM | POA: Diagnosis not present

## 2024-02-18 DIAGNOSIS — E782 Mixed hyperlipidemia: Secondary | ICD-10-CM | POA: Diagnosis not present

## 2024-02-18 DIAGNOSIS — I959 Hypotension, unspecified: Secondary | ICD-10-CM | POA: Diagnosis not present

## 2024-03-07 DIAGNOSIS — I700 Atherosclerosis of aorta: Secondary | ICD-10-CM | POA: Diagnosis not present

## 2024-03-07 DIAGNOSIS — I10 Essential (primary) hypertension: Secondary | ICD-10-CM | POA: Diagnosis not present

## 2024-03-13 DIAGNOSIS — I700 Atherosclerosis of aorta: Secondary | ICD-10-CM | POA: Diagnosis not present

## 2024-03-13 DIAGNOSIS — I10 Essential (primary) hypertension: Secondary | ICD-10-CM | POA: Diagnosis not present

## 2024-04-07 DIAGNOSIS — I700 Atherosclerosis of aorta: Secondary | ICD-10-CM | POA: Diagnosis not present

## 2024-04-07 DIAGNOSIS — E782 Mixed hyperlipidemia: Secondary | ICD-10-CM | POA: Diagnosis not present

## 2024-04-07 DIAGNOSIS — I10 Essential (primary) hypertension: Secondary | ICD-10-CM | POA: Diagnosis not present

## 2024-04-07 DIAGNOSIS — M47816 Spondylosis without myelopathy or radiculopathy, lumbar region: Secondary | ICD-10-CM | POA: Diagnosis not present

## 2024-04-12 DIAGNOSIS — I10 Essential (primary) hypertension: Secondary | ICD-10-CM | POA: Diagnosis not present

## 2024-04-12 DIAGNOSIS — I700 Atherosclerosis of aorta: Secondary | ICD-10-CM | POA: Diagnosis not present

## 2024-04-18 ENCOUNTER — Encounter: Payer: Self-pay | Admitting: Pulmonary Disease

## 2024-04-18 ENCOUNTER — Ambulatory Visit: Admitting: Pulmonary Disease

## 2024-04-18 ENCOUNTER — Other Ambulatory Visit (HOSPITAL_COMMUNITY): Payer: Self-pay

## 2024-04-18 ENCOUNTER — Ambulatory Visit (INDEPENDENT_AMBULATORY_CARE_PROVIDER_SITE_OTHER): Admitting: Pulmonary Disease

## 2024-04-18 ENCOUNTER — Telehealth: Payer: Self-pay

## 2024-04-18 VITALS — BP 135/76 | HR 60 | Ht 70.0 in | Wt 169.0 lb

## 2024-04-18 DIAGNOSIS — R0602 Shortness of breath: Secondary | ICD-10-CM

## 2024-04-18 DIAGNOSIS — J4530 Mild persistent asthma, uncomplicated: Secondary | ICD-10-CM

## 2024-04-18 LAB — PULMONARY FUNCTION TEST
DL/VA % pred: 73 %
DL/VA: 2.86 ml/min/mmHg/L
DLCO cor % pred: 62 %
DLCO cor: 15.31 ml/min/mmHg
DLCO unc % pred: 62 %
DLCO unc: 15.31 ml/min/mmHg
FEF 25-75 Post: 1.42 L/s
FEF 25-75 Pre: 0.91 L/s
FEF2575-%Change-Post: 57 %
FEF2575-%Pred-Post: 72 %
FEF2575-%Pred-Pre: 45 %
FEV1-%Change-Post: 15 %
FEV1-%Pred-Post: 66 %
FEV1-%Pred-Pre: 58 %
FEV1-Post: 1.92 L
FEV1-Pre: 1.66 L
FEV1FVC-%Change-Post: 3 %
FEV1FVC-%Pred-Pre: 90 %
FEV6-%Change-Post: 11 %
FEV6-%Pred-Post: 75 %
FEV6-%Pred-Pre: 67 %
FEV6-Post: 2.83 L
FEV6-Pre: 2.55 L
FEV6FVC-%Change-Post: -1 %
FEV6FVC-%Pred-Post: 105 %
FEV6FVC-%Pred-Pre: 106 %
FVC-%Change-Post: 11 %
FVC-%Pred-Post: 71 %
FVC-%Pred-Pre: 63 %
FVC-Post: 2.88 L
FVC-Pre: 2.58 L
Post FEV1/FVC ratio: 67 %
Post FEV6/FVC ratio: 98 %
Pre FEV1/FVC ratio: 65 %
Pre FEV6/FVC Ratio: 99 %
RV % pred: 138 %
RV: 3.69 L
TLC % pred: 94 %
TLC: 6.69 L

## 2024-04-18 NOTE — Telephone Encounter (Signed)
 Cost for Brand Symbicort  (preferred) is showing as $74.48 per 30 days through [facility] at this time. This is due to the patient having a deductible to be met. After this fill the price should drop down. This would apply to all medications until the deductible is met. 3 months through [facility] is showing as $166.98  *sent to office in original message

## 2024-04-18 NOTE — Patient Instructions (Signed)
Full PFT performed today, 

## 2024-04-18 NOTE — Progress Notes (Signed)
 Miguel Kirby    [id number]    09-16-1945  Primary Care Physician:Shaw, Joen, MD  Referring Physician: Jajuan Skoog, MD [address] [place],  [place] [address]  Chief complaint: Follow up for asthma  HPI: 78 y.o. 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  Discussed the use of AI scribe software for clinical note transcription with the patient, who gave verbal consent to proceed.  History of Present Illness Miguel Kirby is an 78 year old male with hypertension who presents with shortness of breath. He was referred by his medical doctor for evaluation of his respiratory symptoms.  He experiences shortness of breath primarily in the mornings, describing it as a sensation of not getting enough oxygen. This is accompanied by a dry cough and wheezing. Symptoms typically improve after one to two hours, allowing him to function normally for the rest of the day. He has adjusted his routine to accommodate this, waking up early to manage his symptoms before starting his day.  The symptoms began approximately six months ago and have progressively worsened. He has not used any inhalers previously and has not been diagnosed with COPD or asthma. A recent chest x-ray showed mild chronic changes, described as atelectasis.  His past medical history is significant for hypertension. He quit smoking in 1990 after smoking two packs a day for about thirty years. No history of seasonal allergies, significant mucus production, or exposure to  occupational hazards such as asbestos or silica. He occasionally experiences acid reflux.   Interim History: Miguel Kirby is an 78 year old male with asthma who presents for follow-up regarding his inhaler use.  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 Exposures:Denies any exposure to mold, hot tubs, jacuzzis, or feather bedding No h/o chemo/XRT/amiodarone/macrodantin/MTX  No exposure to asbestos, silica or other organic allergens  Smoking history: 60-pack-year smoker.  Quit in 1990 Travel history: No significant travel history Relevant family history: No history of lung disease   Outpatient Encounter Medications as of 04/18/2024  Medication Sig   budesonide -formoterol  (SYMBICORT ) 160-4.5 MCG/ACT inhaler Inhale 2 puffs into the lungs in the morning and at bedtime.   COVID-19 mRNA bivalent vaccine, Pfizer, (PFIZER COVID-19 VAC BIVALENT) injection Inject into the muscle.   hydrochlorothiazide (HYDRODIURIL) 25 MG tablet Take 25 mg by mouth daily.   losartan-hydrochlorothiazide (HYZAAR) 100-25 MG tablet Take 1 tablet by mouth daily.   metoprolol  succinate (TOPROL -XL) 25 MG 24 hr tablet Take 1 tablet (25 mg total) by mouth daily. Take with or immediately following a meal.   predniSONE (DELTASONE) 10 MG tablet Take 10 mg by mouth  daily. PATIENT TAKING 2 TABLETS DAILY FOR 3 WEEKS.   simvastatin (ZOCOR) 10 MG tablet Take 1 tablet by mouth  daily.   amLODipine (NORVASC) 5 MG tablet Take 1 tablet by mouth daily. (Patient not taking: Reported on 04/18/2024)   cetirizine (ZYRTEC) 10 MG tablet Take 10 mg by mouth daily. (Patient not taking: Reported on 04/18/2024)   No facility-administered encounter medications on file as of 04/18/2024.   Vitals:   04/18/24 0939  BP: 135/76  Pulse: 60  Height: 5' 10 (1.778 m)  Weight: 169 lb (76.7 kg)  SpO2: 97%  BMI (Calculated): 24.25   Physical Exam GEN: No acute distress CV: Regular rate and rhythm no murmurs LUNGS: Clear to auscultation bilaterally normal respiratory effort SKIN JOINTS: Warm and dry no rash   Data Reviewed: Imaging: CT chest 09/30/2018-lingular scarring, atelectasis Chest x-ray 12/24/2023-chronic changes in the lingula. I have reviewed the images personally.  PFTs: 04/18/2024 FVC 2.88 [71%], FEV1 1.92 [of 6%], TLC 6.69 [94%], DLCO 15.31 [62%] Bronchodilator response, mild diffusion defect  FENO 01/29/2024- 24  Labs: CBC 01/29/2024-WBC 4.6, eos 4.2%, absolute eosinophil count 193 IgE 01/29/2024-2 Assessment & Plan Asthma Mild asthma with symptom improvement following Symbicort  initiation. Lung function test confirms asthma, but results are normal. Blood tests for inflammation related to allergies or asthma are normal. Symbicort  has led to a bronchodilator response, indicating effective treatment. Cost of Symbicort  is a concern for him. - Check with insurance company and pharmacy team for formulary options and preferred inhalers. - Reduce Symbicort  dose to one puff twice daily. - Consider further dose reduction at next visit if symptoms remain controlled. - Ensure he is informed about refill options and contact him if needed.  Recommendations: Continue Symbicort .  Check for alternatives  Lonna Coder MD [facility] 04/18/2024, 9:57 AM  CC: Keonda Dow, MD

## 2024-04-18 NOTE — Telephone Encounter (Signed)
 Patient reports difficulty affording copay for Symbicort  inhaler when seen by Dr. Theophilus today.   Can you assist with identifying the preferred inhaler per patient's insurance?   Please route to Dr. Theophilus and Kelly Kijania-Swaringen.   Thanks,   Aleck Puls, PharmD, BCPS Clinical Pharmacist  [facility]

## 2024-04-18 NOTE — Patient Instructions (Signed)
  VISIT SUMMARY: Miguel Kirby, an 78 year old male with asthma, came in for a follow-up regarding his inhaler use. He reported significant improvement in his respiratory symptoms since starting Symbicort , but expressed concerns about the cost of the medication.  YOUR PLAN: -ASTHMA: Asthma is a condition where your airways narrow and swell, producing extra mucus, which can make breathing difficult. Your asthma symptoms have improved significantly with Symbicort , but the cost is a concern. We will check with your insurance company and pharmacy team for alternative inhalers that might be more affordable. For now, reduce your Symbicort  dose to one puff twice daily. If your symptoms remain controlled, we may consider further reducing the dose at your next visit. Please ensure you are informed about your refill options and contact us  if needed.  INSTRUCTIONS: We will check with your insurance company and pharmacy team for formulary options and preferred inhalers. Reduce your Symbicort  dose to one puff twice daily. We will consider further dose reduction at your next visit if your symptoms remain controlled. Ensure you are informed about your refill options and contact us  if needed.                      Contains text generated by [employer]bridge.                                 Contains text generated by [employer]bridge.

## 2024-04-18 NOTE — Progress Notes (Signed)
 Full PFT performed today.

## 2024-04-20 NOTE — Telephone Encounter (Signed)
 Thnk you   LM - okay per DPR  Must meet deducible before cost goes down.   - NFN

## 2024-04-22 ENCOUNTER — Telehealth: Payer: Self-pay | Admitting: Internal Medicine

## 2024-04-22 DIAGNOSIS — I493 Ventricular premature depolarization: Secondary | ICD-10-CM

## 2024-04-22 MED ORDER — METOPROLOL SUCCINATE ER 25 MG PO TB24: 25.0000 mg | ORAL_TABLET | Freq: Every day | ORAL | 0 refills | Status: DC

## 2024-04-22 NOTE — Telephone Encounter (Signed)
 LEFT PATIENT A VOICEMAIL CONFIRMING MEDICATION WAS SENT TO DESIRED PHARMACY BUT COULD ONLY SEND 30 TABLETS DUE TO NEEDING AN ANNUAL APPOINTMENT.

## 2024-04-22 NOTE — Telephone Encounter (Signed)
*  STAT* If patient is at the pharmacy, call can be transferred to refill team.   1. Which medications need to be refilled? (please list name of each medication and dose if known)   metoprolol  succinate (TOPROL -XL) 25 MG 24 hr tablet     2. Would you like to learn more about the convenience, safety, & potential cost savings by using the [facility]? No   3. Are you open to using the [facility] (Type [facility].) No   4. Which pharmacy/location (including street and city if local pharmacy) is medication to be sent to? [facility] [place], [place] - [address]    5. Do they need a 30 day or 90 day supply? 90 day

## 2024-04-25 ENCOUNTER — Other Ambulatory Visit: Payer: Self-pay

## 2024-04-25 ENCOUNTER — Other Ambulatory Visit (HOSPITAL_COMMUNITY): Payer: Self-pay

## 2024-04-25 ENCOUNTER — Telehealth: Payer: Self-pay | Admitting: Student in an Organized Health Care Education/Training Program

## 2024-04-25 DIAGNOSIS — I493 Ventricular premature depolarization: Secondary | ICD-10-CM

## 2024-04-25 MED ORDER — METOPROLOL SUCCINATE ER 25 MG PO TB24: 25.0000 mg | ORAL_TABLET | Freq: Every day | ORAL | 0 refills | Status: DC

## 2024-04-25 MED ORDER — METOPROLOL SUCCINATE ER 25 MG PO TB24
25.0000 mg | ORAL_TABLET | Freq: Every day | ORAL | 0 refills | Status: DC
  Filled 2024-04-25: qty 30, 30d supply, fill #0

## 2024-04-25 NOTE — Telephone Encounter (Signed)
*  STAT* If patient is at the pharmacy, call can be transferred to refill team.   1. Which medications need to be refilled? (please list name of each medication and dose if known) metoprolol succinate (TOPROL-XL) 25 MG 24 hr tablet  2. Which pharmacy/location (including street and city if local pharmacy) is medication to be sent to? [facility] - [place], [place] - [address]  3. Do they need a 30 day or 90 day supply? 90 day supply   

## 2024-05-08 DIAGNOSIS — I10 Essential (primary) hypertension: Secondary | ICD-10-CM | POA: Diagnosis not present

## 2024-05-08 DIAGNOSIS — E782 Mixed hyperlipidemia: Secondary | ICD-10-CM | POA: Diagnosis not present

## 2024-05-08 DIAGNOSIS — I700 Atherosclerosis of aorta: Secondary | ICD-10-CM | POA: Diagnosis not present

## 2024-05-08 DIAGNOSIS — M47816 Spondylosis without myelopathy or radiculopathy, lumbar region: Secondary | ICD-10-CM | POA: Diagnosis not present

## 2024-05-12 DIAGNOSIS — I10 Essential (primary) hypertension: Secondary | ICD-10-CM | POA: Diagnosis not present

## 2024-05-12 DIAGNOSIS — I700 Atherosclerosis of aorta: Secondary | ICD-10-CM | POA: Diagnosis not present

## 2024-05-16 ENCOUNTER — Ambulatory Visit
Attending: Student in an Organized Health Care Education/Training Program | Admitting: Student in an Organized Health Care Education/Training Program

## 2024-05-16 VITALS — BP 114/56 | HR 67 | Ht 69.5 in | Wt 170.0 lb

## 2024-05-16 DIAGNOSIS — E7800 Pure hypercholesterolemia, unspecified: Secondary | ICD-10-CM | POA: Diagnosis not present

## 2024-05-16 DIAGNOSIS — I493 Ventricular premature depolarization: Secondary | ICD-10-CM | POA: Diagnosis not present

## 2024-05-16 DIAGNOSIS — I10 Essential (primary) hypertension: Secondary | ICD-10-CM | POA: Diagnosis present

## 2024-05-16 MED ORDER — LOSARTAN POTASSIUM-HCTZ 100-25 MG PO TABS: 1.0000 | ORAL_TABLET | Freq: Every day | ORAL | 3 refills | Status: AC

## 2024-05-16 MED ORDER — METOPROLOL SUCCINATE ER 25 MG PO TB24: 25.0000 mg | ORAL_TABLET | Freq: Every day | ORAL | 3 refills | Status: AC

## 2024-05-16 NOTE — Patient Instructions (Signed)
 Medication Instructions:  Refills sent in today for: - Metoprolol  Succinate 25 mg daily - Losartan -hydrochlorothiazide 100-25 mg daily   *If you need a refill on your cardiac medications before your next appointment, please call your pharmacy*  LABS: BMP   Follow-Up: At [facility], you and your health needs are our priority.  As part of our continuing mission to provide you with exceptional heart care, our providers are all part of one team.  This team includes your primary Cardiologist (physician) and Advanced Practice Providers or APPs (Physician Assistants and Nurse Practitioners) who all work together to provide you with the care you need, when you need it.  Your next appointment:   1 year(s)  Provider:   Georganna Archer, MD    We recommend signing up for the patient portal called MyChart.  Sign up information is provided on this After Visit Summary.  MyChart is used to connect with patients for Virtual Visits (Telemedicine).  Patients are able to view lab/test results, encounter notes, upcoming appointments, etc.  Non-urgent messages can be sent to your provider as well.   To learn more about what you can do with MyChart, go to [email or web address].

## 2024-05-16 NOTE — Progress Notes (Addendum)
 Cardiology Office Note:   Date:  05/16/2024  ID:  Miguel Kirby, DOB Apr 13, 1946, MRN [id number] PCP: Loreli Kins, MD  [facility] Providers Cardiologist:  Georganna Archer, MD { Chief Complaint:  Chief Complaint  Patient presents with   Hypertension    History of Present Illness:   Miguel Kirby is a 78 y.o. male with a PMH of HTN, HLD, frequent PVCs and asthma who presents for follow-up.  He was a previous patient of Dr. Ronal Ross.  Last seen in clinic by Dr. Ross on 04/06/2023 and had reports of presyncope at that time.  He underwent extensive workup including echo, event monitor, MRA head and NM stress which were all normal.  Today he reports that he is asymptomatic and feels great.  Blood pressures have been well-controlled.  Currently takes HCTZ 25 mg daily, losartan  100 mg daily, and metoprolol  succinate 25 mg daily.  On simvastatin 10 mg daily for cholesterol.  Tolerating well.  Last lipids checked at [facility] primary care on 11/17/23 which showed a normal LDL 73 and triglycerides 84.   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 left bridge of nose tx MOHS   BCC (basal cell carcinoma of skin) 12/09/2018   bcc nod. 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   right pariatial scalp superior in situ ([id number])   SCCA (squamous cell carcinoma) of skin 02/25/2016   scc in situ outer corner left eye tx fluorouracil cream   SCCA (squamous cell carcinoma) of skin 05/13/2018   scc in situ right cheek tx [id number]   SCCA (squamous cell carcinoma) of skin 11/01/2018   scc in situ left scalp tx [place] @ f/u   Trigger finger    multiple     ROS: Denies chest pain, SOB, PND, orthopnea, swelling,  palpitations   Studies Reviewed:    EKG:  EKG Interpretation Date/Time:  Monday May 16 2024 13:28:27 EDT Ventricular Rate:  67 PR Interval:  160 QRS Duration:  82 QT Interval:  412 QTC Calculation: 435 R Axis:   72  Text Interpretation: Sinus rhythm with sinus arrhythmia with occasional Premature ventricular complexes When compared with ECG of 06-Apr-2023 13:36, No significant change was found Confirmed by Archer Georganna [id number]) on 05/16/2024 1:53:40 PM     Cardiac Studies & Procedures   ______________________________________________________________________________________________   STRESS TESTS  MYOCARDIAL PERFUSION IMAGING 04/30/2023  Interpretation Summary   The study is normal. The study is low risk.   No ST deviation was noted.   Left ventricular function is normal. Nuclear stress EF: 72%. The left ventricular ejection fraction is hyperdynamic (>65%). End diastolic cavity size is normal. End systolic cavity size is normal.  Normal Perfusion. Negative Stress test.  Low risk. Frequent PVCs. Compared to prior study, ST changes have resolved but PVC burden has increased (only one, isolated PVC in 2016 study).   ECHOCARDIOGRAM  ECHOCARDIOGRAM COMPLETE 04/27/2023  Narrative ECHOCARDIOGRAM REPORT    Patient Name:   Miguel Kirby    Date of Exam: 04/27/2023 Medical Rec #:  [id number]     Height:       69.5 in Accession #:  [id number]    Weight:       171.0 lb Date of Birth:  March 10, 1946     BSA:          1.943 m Patient Age:    77 years      BP:           135/62 mmHg Patient Gender: M             HR:           62 bpm. Exam Location:  [address]  Procedure: 2D Echo  Indications:    H81.4 Vertigo  History:        Patient has no prior history of Echocardiogram examinations. Signs/Symptoms:Dizziness/Lightheadedness. No Cardiac history.  Sonographer:    Waldo Guadalajara RCS Referring Phys: [phone] MARY E BRANCH  IMPRESSIONS   1. Left ventricular ejection  fraction, by estimation, is 65 to 70%. The left ventricle has normal function. The left ventricle has no regional wall motion abnormalities. Left ventricular diastolic parameters were normal. 2. Right ventricular systolic function is normal. The right ventricular size is normal. There is normal pulmonary artery systolic pressure. The estimated right ventricular systolic pressure is 18.8 mmHg. 3. The mitral valve is normal in structure. Trivial mitral valve regurgitation. 4. The aortic valve is tricuspid. Aortic valve regurgitation is not visualized. No aortic stenosis is present. 5. The inferior vena cava is normal in size with greater than 50% respiratory variability, suggesting right atrial pressure of 3 mmHg.  FINDINGS Left Ventricle: Left ventricular ejection fraction, by estimation, is 65 to 70%. The left ventricle has normal function. The left ventricle has no regional wall motion abnormalities. The left ventricular internal cavity size was normal in size. There is no left ventricular hypertrophy. Left ventricular diastolic parameters were normal.  Right Ventricle: The right ventricular size is normal. No increase in right ventricular wall thickness. Right ventricular systolic function is normal. There is normal pulmonary artery systolic pressure. The tricuspid regurgitant velocity is 1.99 m/s, and with an assumed right atrial pressure of 3 mmHg, the estimated right ventricular systolic pressure is 18.8 mmHg.  Left Atrium: Left atrial size was normal in size.  Right Atrium: Right atrial size was normal in size.  Pericardium: There is no evidence of pericardial effusion.  Mitral Valve: The mitral valve is normal in structure. Trivial mitral valve regurgitation.  Tricuspid Valve: The tricuspid valve is normal in structure. Tricuspid valve regurgitation is trivial.  Aortic Valve: The aortic valve is tricuspid. Aortic valve regurgitation is not visualized. No aortic stenosis is  present.  Pulmonic Valve: The pulmonic valve was not well visualized. Pulmonic valve regurgitation is not visualized.  Aorta: The aortic root is normal in size and structure.  Venous: The inferior vena cava is normal in size with greater than 50% respiratory variability, suggesting right atrial pressure of 3 mmHg.  IAS/Shunts: The interatrial septum was not well visualized.   LEFT VENTRICLE PLAX 2D LVIDd:         4.20 cm   Diastology LVIDs:         2.50 cm   LV e' medial:    9.90 cm/s LV PW:         0.90 cm   LV E/e' medial:  12.9 LV IVS:        0.80 cm   LV e' lateral:   12.50 cm/s LVOT diam:     1.70 cm   LV E/e' lateral: 10.2 LV SV:  49 LV SV Index:   25 LVOT Area:     2.27 cm   RIGHT VENTRICLE RV Basal diam:  3.10 cm RV S prime:     14.50 cm/s RVSP:           18.8 mmHg  LEFT ATRIUM             Index        RIGHT ATRIUM           Index LA diam:        3.30 cm 1.70 cm/m   RA Pressure: 3.00 mmHg LA Vol (A2C):   36.3 ml 18.69 ml/m  RA Area:     12.40 cm LA Vol (A4C):   45.9 ml 23.63 ml/m  RA Volume:   25.10 ml  12.92 ml/m LA Biplane Vol: 42.2 ml 21.72 ml/m AORTIC VALVE LVOT Vmax:   92.60 cm/s LVOT Vmean:  66.700 cm/s LVOT VTI:    0.218 m  AORTA Ao Root diam: 3.10 cm Ao Asc diam:  3.20 cm  MITRAL VALVE                TRICUSPID VALVE MV Area (PHT):              TR Peak grad:   15.8 mmHg MV Decel Time:              TR Vmax:        199.00 cm/s MV E velocity: 128.00 cm/s  Estimated RAP:  3.00 mmHg MV A velocity: 124.00 cm/s  RVSP:           18.8 mmHg MV E/A ratio:  1.03 SHUNTS Systemic VTI:  0.22 m Systemic Diam: 1.70 cm  Lonni Nanas MD Electronically signed by Lonni Nanas MD Signature Date/Time: 04/27/2023/11:18:56 AM    Final    MONITORS  LONG TERM MONITOR (3-14 DAYS) 04/22/2023  Narrative 1 triggered event. Frequent PVCs 10%  Patch Wear Time:  6 days and 19 hours (2024-08-02T10:04:25-0400 to  2024-08-09T05:31:13-0400)  Patient had a min HR of 50 bpm, max HR of 164 bpm, and avg HR of 78 bpm. Predominant underlying rhythm was Sinus Rhythm. 16 Supraventricular Tachycardia runs occurred, the run with the fastest interval lasting 5 beats with a max rate of 164 bpm, the longest lasting 14 beats with an avg rate of 128 bpm. Some episodes of Supraventricular Tachycardia may be possible Atrial Tachycardia with variable block. Isolated SVEs were occasional (1.5%, 11182), SVE Couplets were rare (<1.0%, 84), and SVE Triplets were rare (<1.0%, 9). Isolated VEs were frequent (10.6%, 80591), VE Couplets were rare (<1.0%, 225), and VE Triplets were rare (<1.0%, 10). Ventricular Bigeminy and Trigeminy were present.       ______________________________________________________________________________________________      Risk Assessment/Calculations:              Physical Exam:   VS:  BP (!) 114/56 (BP Location: Left Arm, Patient Position: Sitting, Cuff Size: Normal)   Pulse 67   Ht 5' 9.5 (1.765 m)   Wt 170 lb (77.1 kg)   SpO2 94%   BMI 24.74 kg/m    Wt Readings from Last 3 Encounters:  04/18/24 169 lb (76.7 kg)  01/29/24 164 lb 12.8 oz (74.8 kg)  04/30/23 171 lb (77.6 kg)     GEN: Well nourished, well developed in no acute distress NECK: No JVD; No carotid bruits CARDIAC: Regular rate with frequent ectopy, no murmurs, rubs, gallops RESPIRATORY:  Clear to auscultation without rales, wheezing  or rhonchi  ABDOMEN: Soft, non-tender, non-distended EXTREMITIES:  No edema; No deformity, 2+ radial pulses   Assessment & Plan Primary hypertension Patient doing very well overall without symptoms.  His blood pressure is at goal and he is taking his medications as prescribed.  We will refill his antihypertensive medications today and check a BMP for electrolytes.  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  His burden on Holter monitoring was  10%.  He is asymptomatic and has no stigmata of heart failure.  Will continue metoprolol . Hypercholesteremia Cholesterol checked by PCP in March was at goal.  Repeat annually.          Signed, Georganna Archer, MD 05/16/2024 7:57 AM    [facility]

## 2024-05-16 NOTE — Assessment & Plan Note (Signed)
 Patient doing very well overall without symptoms.  His blood pressure is at goal and he is taking his medications as prescribed.  We will refill his antihypertensive medications today and check a BMP for electrolytes.

## 2024-05-17 ENCOUNTER — Ambulatory Visit: Payer: Self-pay | Admitting: Student in an Organized Health Care Education/Training Program

## 2024-05-17 DIAGNOSIS — I10 Essential (primary) hypertension: Secondary | ICD-10-CM

## 2024-05-17 DIAGNOSIS — R7989 Other specified abnormal findings of blood chemistry: Secondary | ICD-10-CM

## 2024-05-17 LAB — BASIC METABOLIC PANEL WITH GFR
BUN/Creatinine Ratio: 13 (ref 10–24)
BUN: 19 mg/dL (ref 8–27)
CO2: 25 mmol/L (ref 20–29)
Calcium: 9.5 mg/dL (ref 8.6–10.2)
Chloride: 101 mmol/L (ref 96–106)
Creatinine, Ser: 1.41 mg/dL — ABNORMAL HIGH (ref 0.76–1.27)
Glucose: 77 mg/dL (ref 70–99)
Potassium: 4.4 mmol/L (ref 3.5–5.2)
Sodium: 140 mmol/L (ref 134–144)
eGFR: 50 mL/min/1.73 — ABNORMAL LOW (ref >59)

## 2024-05-17 NOTE — Telephone Encounter (Signed)
 Patient returned RN Brooke's call.

## 2024-05-17 NOTE — Progress Notes (Signed)
 Attempted to discuss results, but pt would like to clarify information on his medication. Pt will contact office to discuss further.

## 2024-05-24 DIAGNOSIS — I10 Essential (primary) hypertension: Secondary | ICD-10-CM | POA: Diagnosis not present

## 2024-05-24 DIAGNOSIS — R7989 Other specified abnormal findings of blood chemistry: Secondary | ICD-10-CM | POA: Diagnosis not present

## 2024-05-25 LAB — BASIC METABOLIC PANEL WITH GFR
BUN/Creatinine Ratio: 14 (ref 10–24)
BUN: 13 mg/dL (ref 8–27)
CO2: 23 mmol/L (ref 20–29)
Calcium: 9.6 mg/dL (ref 8.6–10.2)
Chloride: 100 mmol/L (ref 96–106)
Creatinine, Ser: 0.9 mg/dL (ref 0.76–1.27)
Glucose: 77 mg/dL (ref 70–99)
Potassium: 4.1 mmol/L (ref 3.5–5.2)
Sodium: 137 mmol/L (ref 134–144)
eGFR: 86 mL/min/1.73 (ref >59)

## 2024-05-31 DIAGNOSIS — Z23 Encounter for immunization: Secondary | ICD-10-CM | POA: Diagnosis not present

## 2024-06-07 DIAGNOSIS — M47816 Spondylosis without myelopathy or radiculopathy, lumbar region: Secondary | ICD-10-CM | POA: Diagnosis not present

## 2024-06-07 DIAGNOSIS — E782 Mixed hyperlipidemia: Secondary | ICD-10-CM | POA: Diagnosis not present

## 2024-06-07 DIAGNOSIS — I700 Atherosclerosis of aorta: Secondary | ICD-10-CM | POA: Diagnosis not present

## 2024-06-07 DIAGNOSIS — I10 Essential (primary) hypertension: Secondary | ICD-10-CM | POA: Diagnosis not present

## 2024-06-11 DIAGNOSIS — I10 Essential (primary) hypertension: Secondary | ICD-10-CM | POA: Diagnosis not present

## 2024-06-11 DIAGNOSIS — I700 Atherosclerosis of aorta: Secondary | ICD-10-CM | POA: Diagnosis not present

## 2024-06-14 DIAGNOSIS — H903 Sensorineural hearing loss, bilateral: Secondary | ICD-10-CM | POA: Diagnosis not present

## 2024-06-25 DIAGNOSIS — Z23 Encounter for immunization: Secondary | ICD-10-CM | POA: Diagnosis not present

## 2024-07-08 DIAGNOSIS — I700 Atherosclerosis of aorta: Secondary | ICD-10-CM | POA: Diagnosis not present

## 2024-07-08 DIAGNOSIS — E782 Mixed hyperlipidemia: Secondary | ICD-10-CM | POA: Diagnosis not present

## 2024-07-08 DIAGNOSIS — M47816 Spondylosis without myelopathy or radiculopathy, lumbar region: Secondary | ICD-10-CM | POA: Diagnosis not present

## 2024-07-08 DIAGNOSIS — I10 Essential (primary) hypertension: Secondary | ICD-10-CM | POA: Diagnosis not present

## 2024-07-11 DIAGNOSIS — R059 Cough, unspecified: Secondary | ICD-10-CM | POA: Diagnosis not present

## 2024-07-11 DIAGNOSIS — J988 Other specified respiratory disorders: Secondary | ICD-10-CM | POA: Diagnosis not present

## 2024-07-11 DIAGNOSIS — I700 Atherosclerosis of aorta: Secondary | ICD-10-CM | POA: Diagnosis not present

## 2024-07-11 DIAGNOSIS — J45909 Unspecified asthma, uncomplicated: Secondary | ICD-10-CM | POA: Diagnosis not present

## 2024-07-11 DIAGNOSIS — I10 Essential (primary) hypertension: Secondary | ICD-10-CM | POA: Diagnosis not present

## 2024-07-11 DIAGNOSIS — R0602 Shortness of breath: Secondary | ICD-10-CM | POA: Diagnosis not present

## 2024-08-07 DIAGNOSIS — I700 Atherosclerosis of aorta: Secondary | ICD-10-CM | POA: Diagnosis not present

## 2024-08-07 DIAGNOSIS — I10 Essential (primary) hypertension: Secondary | ICD-10-CM | POA: Diagnosis not present

## 2024-08-07 DIAGNOSIS — M47816 Spondylosis without myelopathy or radiculopathy, lumbar region: Secondary | ICD-10-CM | POA: Diagnosis not present

## 2024-08-07 DIAGNOSIS — E782 Mixed hyperlipidemia: Secondary | ICD-10-CM | POA: Diagnosis not present

## 2024-10-27 ENCOUNTER — Ambulatory Visit: Admitting: Pulmonary Disease
